# Patient Record
Sex: Female | Born: 1980 | Race: White | Hispanic: No | Marital: Married | Smoking: Never smoker
Health system: Southern US, Community
[De-identification: ages and names within clinical notes are randomized; demographics above are authoritative.]

## PROBLEM LIST (undated history)

## (undated) DIAGNOSIS — G43909 Migraine, unspecified, not intractable, without status migrainosus: Secondary | ICD-10-CM

## (undated) DIAGNOSIS — B019 Varicella without complication: Secondary | ICD-10-CM

## (undated) DIAGNOSIS — O24419 Gestational diabetes mellitus in pregnancy, unspecified control: Secondary | ICD-10-CM

## (undated) HISTORY — DX: Varicella without complication: B01.9

---

## 2007-04-18 HISTORY — PX: BUNIONECTOMY: SHX129

## 2009-05-10 ENCOUNTER — Ambulatory Visit: Payer: Self-pay

## 2009-07-08 ENCOUNTER — Inpatient Hospital Stay: Payer: Self-pay

## 2009-07-16 ENCOUNTER — Ambulatory Visit: Payer: Self-pay | Admitting: Pediatrics

## 2009-09-17 ENCOUNTER — Ambulatory Visit: Payer: Self-pay | Admitting: Pediatrics

## 2012-01-21 ENCOUNTER — Inpatient Hospital Stay: Payer: Self-pay | Admitting: Obstetrics and Gynecology

## 2012-01-21 LAB — COMPREHENSIVE METABOLIC PANEL
Alkaline Phosphatase: 123 U/L (ref 50–136)
Anion Gap: 9 (ref 7–16)
Bilirubin,Total: 1.5 mg/dL — ABNORMAL HIGH (ref 0.2–1.0)
Calcium, Total: 9.2 mg/dL (ref 8.5–10.1)
Chloride: 102 mmol/L (ref 98–107)
Co2: 26 mmol/L (ref 21–32)
EGFR (African American): >60
EGFR (Non-African Amer.): >60
Osmolality: 270 (ref 275–301)
SGOT(AST): 22 U/L (ref 15–37)

## 2012-01-21 LAB — URINALYSIS, COMPLETE
Bilirubin,UR: NEGATIVE
Blood: NEGATIVE
Glucose,UR: NEGATIVE mg/dL (ref 0–75)
Nitrite: NEGATIVE
Ph: 6 (ref 4.5–8.0)
Protein: NEGATIVE
RBC,UR: 3 /HPF (ref 0–5)
Specific Gravity: 1.01 (ref 1.003–1.030)
Squamous Epithelial: 29
WBC UR: 19 /HPF (ref 0–5)

## 2012-01-21 LAB — CBC
MCH: 32.4 pg (ref 26.0–34.0)
MCV: 90 fL (ref 80–100)
Platelet: 189 10*3/uL (ref 150–440)
RDW: 13 % (ref 11.5–14.5)
WBC: 8.3 10*3/uL (ref 3.6–11.0)

## 2012-01-23 DIAGNOSIS — N390 Urinary tract infection, site not specified: Secondary | ICD-10-CM | POA: Insufficient documentation

## 2012-01-23 LAB — URINE CULTURE

## 2012-02-26 DIAGNOSIS — R519 Headache, unspecified: Secondary | ICD-10-CM | POA: Insufficient documentation

## 2012-02-26 DIAGNOSIS — G43809 Other migraine, not intractable, without status migrainosus: Secondary | ICD-10-CM | POA: Insufficient documentation

## 2014-04-17 NOTE — L&D Delivery Note (Signed)
Obstetrical Delivery Note   Date of Delivery:   11/19/2014 at 0124 Primary OB:   [facility] Gestational Age/EDD: [redacted]w[redacted]d (Dated byLMP) Antepartum complications: none  Delivered By:   Farrel Conners, CNM  Delivery Type:   spontaneous vaginal delivery  Procedure Details:   Start of second stage shortly after SROM for clear fluid. While mother in left lateral position, there was a SVD of viable female infant in LOA with left nuchal hand requiring delivery of posterior shoulder first. Good cry with tactile stimulation. Baby placed on mother's chest immediately after birth. Cord clamped after pulsations stopped. Cord cut by FOB.  Anesthesia:    local Intrapartum complications: None GBS:    negative Laceration:    Small 2nd degree repaired with 3-0 Chromic Episiotomy:    No Placenta:    Via active 3rd stage. To pathology: no Estimated Blood Loss:  350 ml  Baby:    Liveborn female, Apgars 7/9, weight pending    Dal Blew, CNM

## 2014-04-20 LAB — HM PAP SMEAR: HM Pap smear: NORMAL

## 2014-08-04 NOTE — Consult Note (Signed)
Referring Physician:  Donzetta Matters :   Primary Care Physician:  [facility], [address], [place], [place] [address], [place]  Dalia Heading [place]) [phone]  Reason for Consult:  Admit Date: 21-Jan-2012   Chief Complaint: vision changes, right arm weakness and numbness and dysarthria, abrupt onset.   Reason for Consult: transient neurologic deficit   History of Present Illness:  History of Present Illness:   34 year old 33.[redacted] week pregnant woman presented to [facility] with vision changes, right arm weakness and numbness and dysarthria.  Her pregnancy has been otherwise uncomplicated.  She had gestational diabetes with her first pregnancy but has not had similar symptoms with this pregnancy.   patient was out doing some errands with her husband earlier today when she noticed that vision in her right visual field looked "like a kaleidoscope".  This symptom came on abruptly and resolved shortly over about 10 minutes.  The symptoms came on while she was standing up and walking around a store.  There have been no recent injuries.  After the symptom resolved, she began to have difficulty getting her words out.  She knew what she wanted to say but couldn't formulate the words.  There was no dysarthria.  At this point, they went back and got in the car and decided to go the ER.  While in the car, her expressive aphasia resolved after a few minutes.  Within the next few minutes she noticed that her right arm felt weak and numb such that she had decreased sensation in the right arm.  This lasted less than 15 minutes and spontaneously resolved. diabetes with her first pregnancy. surgery. history: does not use alcohol, drugs or tobacco. History:FH of neurologic disease, autoimmune disease, clotting disorders or strokes.    GENERAL.and atraumatic. exam without pharmacologic dilation shows normal disc size, appearance and C/D ratio.  There is no  papilledema. and S2 sounds are within normal limits, without murmurs, gallops, or rubs. - Normal- NormalDrift - Absent bilaterally.- Gait and station are within normal limits.    Shoulder abduction (deltoid/supraspinatus, axillary/suprascapular n, C5)   Elbow flexion (biceps brachii, musculoskeletal n, C5-6)   Elbow extension (triceps, radial n, C7)   Finger adduction (interossei, ulnar n, T1)    Hip flexion (iliopsoas, L1/L2)   Knee flexion (hamstrings, sciatic n, L5/S1)    Knee extension (quadriceps, femoral n, L3/4)   Ankle dorsiflexion (tibialis anterior, deep fibular n, L4/5)   Ankle plantarflexion (gastroc, tibial n, S1)  STATUS.is oriented to person, place and time.  Recent and remote memory are intact.  Attention span and concentration are intact.  Naming, repetition, comprehension and expressive speech are within normal limits.  Patient's fund of knowledge is within normal limits for educational level. NERVES.   CN II (normal visual acuity and visual fields)   CN III, IV, VI (extraocular muscles are intact)   CN V (facial sensation is intact bilaterally)   CN VII (facial strength is intact bilaterally)   CN VIII (hearing is intact bilaterally)   CN IX/X (palate elevates midline, normal phonation)   CN XI (shoulder shrug strength is normal and symmetric)   CN XII (tongue protrudes midline) to pain and temp bilaterally (spinothalamic tracts)to position and vibration bilaterally (dorsal columns)    Biceps   Brachioradialis    Patellar   Achilles  Finger to nose testing is within normal limits bilaterally.have personally viewed and interpreted the MRI, MRA and MRV scans.  The MRI is  negative for infarct.  The MRA is unremarkable.  There is abnormal signal in the right internal jugular vein and sigmoid sinus more likely representing thrombus than slow flow.  There is only a T1 sagittal view.  The patient needs a coronal and axial views of time of flight.  There was only a sagittal time of flight which  is limited.  Alternatively, the patient could have a carotid ultrasound and if this is negative then could perform the coronal and axial views time of flight.   [place] and Dr. Guss Bunde with [facility] in [facility] who emergently read this scan. 34 year old 33.[redacted] week pregnant woman presented to [facility] with vision changes, right arm weakness and numbness and dysarthria.  Her symptoms are most consistent with TIA.  Her MRV as above shows likely clot in the right internal jugular vein.   shows her prothrombotic state of pregnancy has contributed to her presentation today.  She should be screened for common causes of prothrombotic state including ATIII, protein C, protein S, factor V leiden, hyperhomocysteinemia, lupus anticoagulant, and prothrombin G20210A.   I spoke with radiology Dr Allena Katz about the results obtained from Drs. Jimmey Ralph and [place] with [facility] showing a likely clot in the right internal jugular vein and sigmoid sinus as outlined above.  As such, we have all agreed to start anticoagulation with lovenox.  I discussed this with Dr. Patton Salles who agrees that starting lovenox is the most appropriate anticoagulation choice at this time.  The patient will require a stat ultrasound to evaluate for venous thrombosis in the right internal jugular vein.  If this test is negative, then she will need to repeat her MRV with coronal and axial plane images.  If the test is positive for DVT then we will not need to do the repeat MRV sequence.    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     ROS:   General denies complaints    HEENT no complaints    Lungs no complaints    Cardiac no complaints    GI no complaints    GU no complaints    Musculoskeletal no complaints    Extremities no complaints    Skin no complaints    Endocrine no complaints    Psych no complaints   Home Medications: Medication Instructions Last Modified Date/Time  prenatal vitamin 1 tab(s) orally once a day  24-Mar-11 14:49   [facility] Current Meds:  Dextrose 5%-NaCl 0.45%, 1000 ml at 125 ml/hr  Aspirin Chewable, 81 mg Oral daily  - Indication: Pain/Fever/Thromboembolic Disorders/Post MI/Prophylaxis MI  Acetaminophen * tablet,  ( Tylenol (325 mg) tablet)  650 mg Oral q4h PRN for pain or temp. greater than 100.4  - Indication: Pain/Fever  Allergies:  No Known Allergies:   Vital Signs: **Vital Signs.:   06-Oct-13 16:17   Vital Signs Type Admission   Temperature Temperature (F) 97.8   Celsius 36.5   Pulse Pulse 68   Pulse source if not from Vital Sign Device apical   Respirations Respirations 18   Systolic BP Systolic BP 122  Diastolic BP (mmHg) Diastolic BP (mmHg) 93   Mean BP 102   Pulse Ox % Pulse Ox % 99   Pulse Ox Activity Level  At rest   Oxygen Delivery Room Air/ 21 %   Lab Results:  Hepatic:  06-Oct-13 13:46    Bilirubin, Total  1.5   Alkaline Phosphatase 123   SGPT (ALT) 13   SGOT (AST) 22   Total Protein, Serum 7.5   Albumin, Serum  3.1  Routine Chem:  06-Oct-13 13:46    Result Comment pt/ptt - tube over-filled  Result(s) reported on 21 Jan 2012 at 02:18PM.   Glucose, Serum 66   BUN 8   Creatinine (comp)  0.56   Sodium, Serum 137   Potassium, Serum 3.6   Chloride, Serum 102   CO2, Serum 26   Calcium (Total), Serum 9.2   Osmolality (calc) 270   eGFR (African American) >60   eGFR (Non-African American) >60 (eGFR values <26mL/min/1.73 m2 may be an indication of chronic kidney disease (CKD). Calculated eGFR is  useful in patients with stable renal function. The eGFR calculation will not be reliable in acutely ill patients when serum creatinine is changing rapidly. It is not useful in  patients on dialysis. The eGFR calculation may not be applicable to patients at the low and high extremes of body sizes, pregnant women, and vegetarians.)   Anion Gap 9  Routine UA:  06-Oct-13 16:20    Color (UA) Straw   Clarity (UA) Cloudy   Glucose (UA) Negative   Bilirubin (UA) Negative   Ketones (UA) 2+   Specific Gravity (UA) 1.010   Blood (UA) Negative   pH (UA) 6.0   Protein (UA) Negative   Nitrite (UA) Negative   Leukocyte Esterase (UA) 3+ (Result(s) reported on 21 Jan 2012 at 04:46PM.)   RBC (UA) 3 /HPF   WBC (UA) 19 /HPF   Bacteria (UA) 1+   Epithelial Cells (UA) 29 /HPF   Mucous (UA) PRESENT (Result(s) reported on 21 Jan 2012 at 04:46PM.)  Routine Coag:  06-Oct-13 13:46    Activated PTT (APTT) - (A HCT value >55% may artifactually increase the APTT. In one study, the increase was an average of 19%. Reference: "Effect on Routine and Special Coagulation Testing Values of Citrate Anticoagulant Adjustment in Patients with High HCT Values." American Journal of Clinical Pathology 2006;126:400-405.)   Prothrombin -   INR - (INR reference interval applies to patients on anticoagulant therapy. A single INR therapeutic range for coumarins is not optimal for all indications; however, the suggested range for most indications is 2.0 - 3.0. Exceptions to the INR Reference Range may include: Prosthetic heart valves, acute myocardial infarction, prevention of myocardial infarction, and combinations of aspirin and anticoagulant. The need for a higher or lower target INR must be assessed individually. Reference: The Pharmacology and Management of the Vitamin K  antagonists: the seventh ACCP Conference on Antithrombotic and Thrombolytic Therapy. ESPQZ.3007 Sept:126 (3suppl): [id number]. A HCT value >55% may  artifactually increase the PT.  In one study,  the increase was an average of 25%. Reference:  "Effect on Routine and Special Coagulation Testing Values of Citrate Anticoagulant Adjustment in Patients with High HCT Values." American Journal of Clinical Pathology 2006;126:400-405.)  Routine Hem:  06-Oct-13 13:46    WBC (CBC) 8.3   RBC (CBC) 4.52   Hemoglobin (CBC) 14.7   Hematocrit (CBC) 40.7   Platelet Count (CBC) 189 (Result(s) reported on 21 Jan 2012 at 02:30PM.)   MCV  36   MCH 32.4   MCHC 36.0   RDW 13.0   Radiology Results: CT:    06-Oct-13 13:56, CT Head Without Contrast   CT Head Without Contrast    REASON FOR EXAM:    R sided numbness, vision loss, difficulty with speech   - x 1 hr - [redacted] wks pregnant  COMMENTS:   May transport without cardiac monitor    PROCEDURE: CT  - CT HEAD WITHOUT CONTRAST  - Jan 21 2012  1:56PM     RESULT: Comparison:  None    Technique: Multiple axial images from the foramen magnum to the vertex   were obtained without IV contrast.    Findings:      There is no evidence of mass effect, midline shift, or extra-axial fluid   collections.  There is no evidence of a space-occupying lesion or     intracranial hemorrhage. There is no evidence of a cortical-based area of   acute infarction.      The ventricles and sulci are appropriate for the patient's age. The basal   cisterns are patent.    Visualized portions of the orbits are unremarkable. The visualized   portions of the paranasal sinuses and mastoid air cells are unremarkable.     The osseous structures are unremarkable.    IMPRESSION:      No acute intracranial process.    Dictation Site: 1          Verified By: Jennette Banker, M.D., MD   Electronic Signatures: Anabel Bene (MD)  (Signed 06-Oct-13 21:08)  Authored: REFERRING PHYSICIAN, Primary Care Physician, Consult, History of Present Illness, Review of Systems, HOME MEDICATIONS, Current Medications, ALLERGIES, NURSING  VITAL SIGNS, LAB RESULTS, RADIOLOGY RESULTS   Last Updated: 06-Oct-13 21:08 by Anabel Bene (MD)

## 2014-11-18 DIAGNOSIS — O09293 Supervision of pregnancy with other poor reproductive or obstetric history, third trimester: Secondary | ICD-10-CM | POA: Diagnosis present

## 2014-11-18 DIAGNOSIS — Z3A40 40 weeks gestation of pregnancy: Secondary | ICD-10-CM | POA: Diagnosis present

## 2014-11-18 DIAGNOSIS — O480 Post-term pregnancy: Principal | ICD-10-CM | POA: Diagnosis present

## 2014-11-18 DIAGNOSIS — O322XX0 Maternal care for transverse and oblique lie, not applicable or unspecified: Secondary | ICD-10-CM | POA: Diagnosis not present

## 2014-11-18 DIAGNOSIS — Z3009 Encounter for other general counseling and advice on contraception: Secondary | ICD-10-CM

## 2014-11-19 ENCOUNTER — Inpatient Hospital Stay
Admission: EM | Admit: 2014-11-19 | Discharge: 2014-11-20 | DRG: 775 | Disposition: A | Discharge status: Home / Self Care | Payer: BLUE CROSS/BLUE SHIELD | Attending: Certified Nurse Midwife | Admitting: Certified Nurse Midwife

## 2014-11-19 ENCOUNTER — Encounter: Payer: Self-pay | Admitting: Certified Nurse Midwife

## 2014-11-19 DIAGNOSIS — Z3009 Encounter for other general counseling and advice on contraception: Secondary | ICD-10-CM | POA: Diagnosis not present

## 2014-11-19 DIAGNOSIS — O322XX0 Maternal care for transverse and oblique lie, not applicable or unspecified: Secondary | ICD-10-CM | POA: Diagnosis not present

## 2014-11-19 DIAGNOSIS — O480 Post-term pregnancy: Secondary | ICD-10-CM | POA: Diagnosis present

## 2014-11-19 DIAGNOSIS — Z3490 Encounter for supervision of normal pregnancy, unspecified, unspecified trimester: Secondary | ICD-10-CM

## 2014-11-19 DIAGNOSIS — O09293 Supervision of pregnancy with other poor reproductive or obstetric history, third trimester: Secondary | ICD-10-CM | POA: Diagnosis present

## 2014-11-19 DIAGNOSIS — Z3A40 40 weeks gestation of pregnancy: Secondary | ICD-10-CM | POA: Diagnosis present

## 2014-11-19 HISTORY — DX: Migraine, unspecified, not intractable, without status migrainosus: G43.909

## 2014-11-19 HISTORY — DX: Gestational diabetes mellitus in pregnancy, unspecified control: O24.419

## 2014-11-19 LAB — CBC
HCT: 37.8 % (ref 35.0–47.0)
Hemoglobin: 13 g/dL (ref 12.0–16.0)
MCH: 31.2 pg (ref 26.0–34.0)
MCHC: 34.4 g/dL (ref 32.0–36.0)
MCV: 90.7 fL (ref 80.0–100.0)
Platelets: 177 10*3/uL (ref 150–440)
RBC: 4.17 MIL/uL (ref 3.80–5.20)
RDW: 12.9 % (ref 11.5–14.5)
WBC: 13.9 10*3/uL — AB (ref 3.6–11.0)

## 2014-11-19 LAB — ABO/RH: ABO/RH(D): B POS

## 2014-11-19 MED ORDER — LIDOCAINE HCL (PF) 1 % IJ SOLN
30.0000 mL | INTRAMUSCULAR | Status: DC | PRN
  Filled 2014-11-19: qty 30

## 2014-11-19 MED ORDER — ONDANSETRON HCL 4 MG/2ML IJ SOLN: 4.0000 mg | INTRAMUSCULAR | Status: DC | PRN

## 2014-11-19 MED ORDER — IBUPROFEN 600 MG PO TABS
600.0000 mg | ORAL_TABLET | Freq: Four times a day (QID) | ORAL | Status: DC | PRN
  Administered 2014-11-19: 600 mg via ORAL
  Filled 2014-11-19 (×2): qty 1

## 2014-11-19 MED ORDER — LACTATED RINGERS IV SOLN: 500.0000 mL | INTRAVENOUS | Status: DC | PRN

## 2014-11-19 MED ORDER — LACTATED RINGERS IV SOLN: INTRAVENOUS | Status: DC

## 2014-11-19 MED ORDER — ONDANSETRON HCL 4 MG PO TABS: 4.0000 mg | ORAL_TABLET | ORAL | Status: DC | PRN

## 2014-11-19 MED ORDER — SIMETHICONE 80 MG PO CHEW: 80.0000 mg | CHEWABLE_TABLET | ORAL | Status: DC | PRN

## 2014-11-19 MED ORDER — LIDOCAINE HCL (PF) 1 % IJ SOLN
INTRAMUSCULAR | Status: AC
  Filled 2014-11-19: qty 30

## 2014-11-19 MED ORDER — BENZOCAINE-MENTHOL 20-0.5 % EX AERO: 1.0000 "application " | INHALATION_SPRAY | CUTANEOUS | Status: DC | PRN

## 2014-11-19 MED ORDER — ONDANSETRON HCL 4 MG/2ML IJ SOLN: 4.0000 mg | Freq: Four times a day (QID) | INTRAMUSCULAR | Status: DC | PRN

## 2014-11-19 MED ORDER — OXYTOCIN BOLUS FROM INFUSION
500.0000 mL | INTRAVENOUS | Status: DC
  Administered 2014-11-19: 500 mL via INTRAVENOUS

## 2014-11-19 MED ORDER — DOCUSATE SODIUM 100 MG PO CAPS: 100.0000 mg | ORAL_CAPSULE | Freq: Two times a day (BID) | ORAL | Status: DC | PRN

## 2014-11-19 MED ORDER — OXYTOCIN 40 UNITS IN LACTATED RINGERS INFUSION - SIMPLE MED
62.5000 mL/h | INTRAVENOUS | Status: DC
  Administered 2014-11-19: 62.5 mL/h via INTRAVENOUS

## 2014-11-19 MED ORDER — MISOPROSTOL 200 MCG PO TABS
800.0000 ug | ORAL_TABLET | Freq: Once | ORAL | Status: DC | PRN
  Filled 2014-11-19: qty 4

## 2014-11-19 MED ORDER — LANOLIN HYDROUS EX OINT: TOPICAL_OINTMENT | CUTANEOUS | Status: DC | PRN

## 2014-11-19 MED ORDER — PRENATAL MULTIVITAMIN CH
1.0000 | ORAL_TABLET | Freq: Every day | ORAL | Status: DC
  Administered 2014-11-19: 1 via ORAL
  Filled 2014-11-19 (×2): qty 1

## 2014-11-19 MED ORDER — DIBUCAINE 1 % RE OINT: 1.0000 "application " | TOPICAL_OINTMENT | RECTAL | Status: DC | PRN

## 2014-11-19 MED ORDER — OXYTOCIN 40 UNITS IN LACTATED RINGERS INFUSION - SIMPLE MED
INTRAVENOUS | Status: AC
  Administered 2014-11-19: 62.5 mL/h via INTRAVENOUS
  Filled 2014-11-19: qty 1000

## 2014-11-19 MED ORDER — OXYTOCIN 40 UNITS IN LACTATED RINGERS INFUSION - SIMPLE MED: 62.5000 mL/h | INTRAVENOUS | Status: DC | PRN

## 2014-11-19 MED ORDER — BUTORPHANOL TARTRATE 1 MG/ML IJ SOLN: 1.0000 mg | INTRAMUSCULAR | Status: DC | PRN

## 2014-11-19 MED ORDER — WITCH HAZEL-GLYCERIN EX PADS: 1.0000 "application " | MEDICATED_PAD | CUTANEOUS | Status: DC | PRN

## 2014-11-19 MED ORDER — HYDROCODONE-ACETAMINOPHEN 5-325 MG PO TABS: 1.0000 | ORAL_TABLET | ORAL | Status: DC | PRN

## 2014-11-19 MED ORDER — AMMONIA AROMATIC IN INHA: 0.3000 mL | Freq: Once | RESPIRATORY_TRACT | Status: DC | PRN

## 2014-11-19 NOTE — Progress Notes (Signed)
  Postpartum Day 0  Subjective: no complaints, up ad lib, voiding and tolerating PO  Objective: Blood pressure 107/61, pulse 71, temperature 98.4 F (36.9 C), temperature source Oral, resp. rate 18, height  (1.702 m), weight 157 lb (71.215 kg), last menstrual period 02/11/2014, SpO2 100 %  Physical Exam:  General: alert and cooperative Lochia: appropriate Uterine Fundus: firm Incision: N/A DVT Evaluation: No evidence of DVT seen on physical exam. Abdomen: soft, NT   Recent Labs  11/19/14 0034  HGB 13.0  HCT 37.8    Assessment PPD #0  Plan: Continue PP care and Advance activity as tolerated  Feeding: breast Contraception: condoms - considering vasectomy or IUD at a later time RI/VI Blood Type: B+   Marta Antu, [place] 11/19/2014, 12:49 PM

## 2014-11-19 NOTE — Lactation Note (Signed)
This note was copied from the chart of Boy Presly Steinruck. Lactation Consultation Note  Patient Name: Boy Rosalia Mcavoy [id number] Date: 11/19/2014     Maternal Data   Mother has no questions. Feeding Feeding Type: Breast Fed Length of feed: 25 min  LATCH Score/Interventions                      Lactation Tools Discussed/Used     Consult Status      Trudee Grip 11/19/2014, 12:29 PM

## 2014-11-19 NOTE — H&P (Signed)
OB History & Physical   History of Present Illness:  Chief Complaint:   HPI:  Sara James is a 34 y.o. G3P0 female at 17 1/[redacted] weeks gestation by EDC=11/18/2014 based on LMP=02/12/2015.  Her pregnancy has been complicated by a history of shoulder dystocia and atypical migraines with G2 and GDM with G1.  She presents to L&D for evaluation of contractions. Contractions started to become more intense and regular at 2200. No VB or LOF. Prenatal care site: Prenatal care at [facility] has been remarkable for normal first trimester test, normal glucola screening, and concerns for a low lying placenta which resolved.      Maternal Medical History:   Past Medical History  Diagnosis Date  . Migraines     hemiplegic migraines with G2  . Gestational diabetes     G1    Past Surgical History  Procedure Laterality Date  . Bunionectomy      No Known Allergies  Prior to Admission medications   Medication Sig Start Date End Date Taking? Authorizing Provider  Prenatal Vit-Fe Fumarate-FA (MULTIVITAMIN-PRENATAL) 27-0.8 MG TABS tablet Take 1 tablet by mouth daily at 12 noon.   Yes Historical Provider, MD          Social History: She  reports that she has never smoked. She does not have any smokeless tobacco history on file. She reports that she does not drink alcohol or use illicit drugs.  Family History: family history includes Cancer in her paternal grandmother; Diabetes in her maternal grandmother and paternal grandmother; Heart disease in her father and paternal grandfather; Hypertension in her father.   Review of Systems: Negative x 10 systems reviewed except as noted in the HPI.      Physical Exam:  Vital Signs: BP 127/80 mmHg  Pulse 74 General: no acute distress.  HEENT: normocephalic, atraumatic Heart: regular rate & rhythm.  No murmurs/rubs/gallops Lungs: clear to auscultation bilaterally Abdomen: soft, gravid, non-tender;  EFW: 7 1/2#. vertex Pelvic:   External: Normal  external female genitalia  Cervix: 6/90%/-1 per RN exam.  Extremities: non-tender, symmetric, no edema bilaterally. Neurologic: Alert & oriented x 3.    Pertinent Results:  Prenatal Labs: Blood type/Rh B positive  Antibody screen negative  Rubella Varicella Immune immune  RPR negative  HBsAg negative  HIV negative  GC negative  Chlamydia negative  Genetic screening First trimester test negative  1 hour GTT 93/ 132  3 hour GTT negative  GBS negative on 7/8   Baseline FHR: 120s with accelerations to 150s to 160, moderate variability Toco: q2-3 min apart   Assessment:  Sara James is a 34 y.o. G3P0 female at 35 1/7 weeks in active labor  Reassuring fetal heart status History of precipitous labor with G2  Plan:  1. Admit to Labor & Delivery  2. CBC, ABO/RH, Clrs, saline lock 3. GBS negative  4. Consents obtained. 5. Can monitor intermittently if ambulating  Sara James  11/19/2014 12:57 AM

## 2014-11-20 LAB — CBC
HCT: 35.4 % (ref 35.0–47.0)
HEMOGLOBIN: 12.1 g/dL (ref 12.0–16.0)
MCH: 31 pg (ref 26.0–34.0)
MCHC: 34.1 g/dL (ref 32.0–36.0)
MCV: 90.8 fL (ref 80.0–100.0)
PLATELETS: 175 10*3/uL (ref 150–440)
RBC: 3.89 MIL/uL (ref 3.80–5.20)
RDW: 13.4 % (ref 11.5–14.5)
WBC: 12.5 10*3/uL — ABNORMAL HIGH (ref 3.6–11.0)

## 2014-11-20 LAB — RPR: RPR Ser Ql: NONREACTIVE

## 2014-11-20 NOTE — Progress Notes (Signed)
Pt discharged at this time via wheelchair escorted by hospital volunteers; pt going home with husband and new baby 

## 2014-11-20 NOTE — Discharge Instructions (Signed)
Please call [facility] and make your own 6 week follow up appointment with Farrel Conners, midwife [phone]  For 2 weeks:  No driving For 6 weeks:  No heavy lifting or strenuous activity For 6 weeks:  No tampons, douches, enemas or sexual intercourse  Call doctor or return to ER:  Temperature of 100.4 or higher; increased abdominal pain; increased vaginal bleeding (passing blood clots the size of your fist or larger OR having to change your pad every hour because it's saturated); chest pain or difficulty breathing; any concerns about postpartum depression (not wanting to see your husband or family, not wanting to see or care for the baby, not having your normal appetite, not having your normal emotions); or for any other concerns  Also call your doctor if:  You have hard, reddened, firm areas at your breast; difficult breastfeeding [facility] Lactation is also a resource for breastfeeding questions/concerns:  [phone]  Tylenol (regular strength):  ; take 2 tablets by mouth every 4-6 hours as needed for pain Ibuprofen (Advil, Motrin):  ; take 3 tablets by mouth every 6 hours as needed for pain Prenatal vitamin:  Take 1 tablet by mouth once a day the whole time you are breastfeeding Colace (stool softener):  May take once or twice a day as needed   Vaginal Delivery, Care After Refer to this sheet in the next few weeks. These discharge instructions provide you with information on caring for yourself after delivery. Your caregiver may also give you specific instructions. Your treatment has been planned according to the most current medical practices available, but problems sometimes occur. Call your caregiver if you have any problems or questions after you go home. HOME CARE INSTRUCTIONS  Take over-the-counter or prescription medicines only as directed by your caregiver or pharmacist.  Do not drink alcohol, especially if you are breastfeeding or taking medicine to relieve  pain.  Do not chew or smoke tobacco.  Do not use illegal drugs.  Continue to use good perineal care. Good perineal care includes:  Wiping your perineum from front to back.  Keeping your perineum clean.  Do not use tampons or douche until your caregiver says it is okay.  Shower, wash your hair, and take tub baths as directed by your caregiver.  Wear a well-fitting bra that provides breast support.  Eat healthy foods.  Drink enough fluids to keep your urine clear or pale yellow.  Eat high-fiber foods such as whole grain cereals and breads, brown rice, beans, and fresh fruits and vegetables every day. These foods may help prevent or relieve constipation.  Follow your caregiver's recommendations regarding resumption of activities such as climbing stairs, driving, lifting, exercising, or traveling.  Talk to your caregiver about resuming sexual activities. Resumption of sexual activities is dependent upon your risk of infection, your rate of healing, and your comfort and desire to resume sexual activity.  Try to have someone help you with your household activities and your newborn for at least a few days after you leave the hospital.  Rest as much as possible. Try to rest or take a nap when your newborn is sleeping.  Increase your activities gradually.  Keep all of your scheduled postpartum appointments. It is very important to keep your scheduled follow-up appointments. At these appointments, your caregiver will be checking to make sure that you are healing physically and emotionally. SEEK MEDICAL CARE IF:   You are passing large clots from your vagina. Save any clots to show your caregiver.  You  have a foul smelling discharge from your vagina.  You have trouble urinating.  You are urinating frequently.  You have pain when you urinate.  You have a change in your bowel movements.  You have increasing redness, pain, or swelling near your vaginal incision (episiotomy) or  vaginal tear.  You have pus draining from your episiotomy or vaginal tear.  Your episiotomy or vaginal tear is separating.  You have painful, hard, or reddened breasts.  You have a severe headache.  You have blurred vision or see spots.  You feel sad or depressed.  You have thoughts of hurting yourself or your newborn.  You have questions about your care, the care of your newborn, or medicines.  You are dizzy or light-headed.  You have a rash.  You have nausea or vomiting.  You were breastfeeding and have not had a menstrual period within 12 weeks after you stopped breastfeeding.  You are not breastfeeding and have not had a menstrual period by the 12th week after delivery.  You have a fever. SEEK IMMEDIATE MEDICAL CARE IF:   You have persistent pain.  You have chest pain.  You have shortness of breath.  You faint.  You have leg pain.  You have stomach pain.  Your vaginal bleeding saturates two or more sanitary pads in 1 hour. MAKE SURE YOU:   Understand these instructions.  Will watch your condition.  Will get help right away if you are not doing well or get worse. Document Released: 03/31/2000 Document Revised: 08/18/2013 Document Reviewed: 11/29/2011 [facility] Patient Information 2015 [place], [place]. This information is not intended to replace advice given to you by your health care provider. Make sure you discuss any questions you have with your health care provider.

## 2014-12-22 ENCOUNTER — Ambulatory Visit: Payer: BLUE CROSS/BLUE SHIELD | Admitting: Nurse Practitioner

## 2014-12-23 ENCOUNTER — Encounter: Payer: Self-pay | Admitting: Nurse Practitioner

## 2014-12-23 ENCOUNTER — Ambulatory Visit (INDEPENDENT_AMBULATORY_CARE_PROVIDER_SITE_OTHER): Payer: BLUE CROSS/BLUE SHIELD | Admitting: Nurse Practitioner

## 2014-12-23 ENCOUNTER — Encounter (INDEPENDENT_AMBULATORY_CARE_PROVIDER_SITE_OTHER): Payer: Self-pay

## 2014-12-23 VITALS — BP 104/84 | HR 65 | Temp 98.1°F | Resp 14 | Ht 67.0 in | Wt 137.0 lb

## 2014-12-23 DIAGNOSIS — Z7189 Other specified counseling: Secondary | ICD-10-CM

## 2014-12-23 DIAGNOSIS — Z7689 Persons encountering health services in other specified circumstances: Secondary | ICD-10-CM

## 2014-12-23 DIAGNOSIS — M654 Radial styloid tenosynovitis [de Quervain]: Secondary | ICD-10-CM | POA: Insufficient documentation

## 2014-12-23 DIAGNOSIS — Z0000 Encounter for general adult medical examination without abnormal findings: Secondary | ICD-10-CM | POA: Insufficient documentation

## 2014-12-23 NOTE — Patient Instructions (Signed)
De Quervain's Disease De Quervain's disease is a condition often seen in racquet sports where there is a soreness (inflammation) in the cord like structures (tendons) which attach muscle to bone on the thumb side of the wrist. There may be a tightening of the tissuesaround the tendons. This condition is often helped by giving up or modifying the activity which caused it. When conservative treatment does not help, surgery may be required. Conservative treatment could include changes in the activity which brought about the problem or made it worse. Anti-inflammatory medications and injections may be used to help decrease the inflammation and help with pain control. Your caregiver will help you determine which is best for you. DIAGNOSIS  Often the diagnosis (learning what is wrong) can be made by examination. Sometimes x-rays are required. HOME CARE INSTRUCTIONS   Apply ice to the sore area for 15-20 minutes, 03-04 times per day while awake. Put the ice in a plastic bag and place a towel between the bag of ice and your skin. This is especially helpful if it can be done after all activities involving the sore wrist.  Temporary splinting may help.  Only take over-the-counter or prescription medicines for pain, discomfort or fever as directed by your caregiver. SEEK MEDICAL CARE IF:   Pain relief is not obtained with medications, or if you have increasing pain and seem to be getting worse rather than better. MAKE SURE YOU:   Understand these instructions.  Will watch your condition.  Will get help right away if you are not doing well or get worse. Document Released: 12/27/2000 Document Revised: 06/26/2011 Document Reviewed: 08/06/2013 ExitCare Patient Information 2015 [employer]. This information is not intended to replace advice given to you by your health care provider. Make sure you discuss any questions you have with your health care provider.  

## 2014-12-23 NOTE — Assessment & Plan Note (Signed)
Discussed acute and chronic issues. Reviewed health maintenance measures, PFSHx, and immunizations. Obtain records from previous facility.   

## 2014-12-23 NOTE — Progress Notes (Signed)
Pre visit review using our clinic review tool, if applicable. No additional management support is needed unless otherwise documented below in the visit note. 

## 2014-12-23 NOTE — Progress Notes (Signed)
Patient ID: Sara James, female    DOB: 12/14/1980  Age: 34 y.o. MRN: [id number]  CC: Establish Care   HPI Sara James presents for establishing care and CC of right wrist pain x 3 months.   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   She has past surgical history that includes Bunionectomy (2009).   Her family history includes Cancer in her paternal grandmother; Diabetes in her maternal grandmother and paternal grandmother; Heart disease in her father and paternal grandfather; Hypertension in her father.She reports that she has never smoked. She has never used smokeless tobacco. She reports that she drinks alcohol. She reports that she does not use illicit drugs.  Outpatient Prescriptions Prior to Visit  Medication Sig Dispense Refill  . Prenatal Vit-Fe Fumarate-FA (MULTIVITAMIN-PRENATAL) 27-0.8 MG TABS tablet Take 1 tablet by mouth daily at 12 noon.     No facility-administered medications prior to visit.    ROS Review of Systems  Constitutional: Negative for fever, chills, diaphoresis and fatigue.  Respiratory: Negative for chest tightness, shortness of breath and wheezing.   Cardiovascular: Negative for chest pain, palpitations and leg swelling.  Gastrointestinal: Negative for nausea, vomiting and diarrhea.  Musculoskeletal: Positive for myalgias.  Skin: Negative for rash.   Neurological: Negative for dizziness, weakness, numbness and headaches.  Psychiatric/Behavioral: Negative for suicidal ideas and sleep disturbance. The patient is not nervous/anxious.     Objective:  BP 104/84 mmHg  Pulse 65  Temp(Src) 98.1 F (36.7 C)  Resp 14  Ht  (1.702 m)  Wt 137 lb (62.143 kg)  BMI 21.45 kg/m2  SpO2 99%  Physical Exam  Constitutional: She is oriented to person, place, and time. She appears well-developed and well-nourished. No distress.  HENT:  Head: Normocephalic and atraumatic.  Right Ear: External ear normal.  Left Ear: External ear normal.  Cardiovascular: Normal rate and regular rhythm.   Pulmonary/Chest: Effort normal and breath sounds normal. No respiratory distress. She has no wheezes. She has no rales. She exhibits no tenderness.  Musculoskeletal: Normal range of motion. She exhibits tenderness. She exhibits no edema.  Positive Finkelstein's  Neurological: She is alert and oriented to person, place, and time. No cranial nerve deficit. She exhibits normal muscle tone. Coordination normal.  Decreased grip strength on right  Skin: Skin is warm and dry. No rash noted. She is not diaphoretic.  Psychiatric: She has a normal mood and affect. Her behavior is normal. Judgment and thought content normal.    Assessment & Plan:   Sara James was seen today for establish care.  Diagnoses and all orders for this visit:  De Quervain's disease (tenosynovitis) -     Ambulatory referral to Orthopedic Surgery  Encounter to establish care  I am having Sara James maintain her multivitamin-prenatal and nystatin cream.  Meds ordered this encounter  Medications  . nystatin cream (MYCOSTATIN)    Sig: Apply 1 application topically 2 (two) times daily.     Follow-up: Return if symptoms worsen or fail to improve.

## 2015-01-05 NOTE — Discharge Summary (Signed)
Obstetrical Discharge Summary  Date of Admission: 11/19/2014 Date of Discharge: 11/20/14 Primary OB: [facility]  Gestational Age at Delivery: [redacted]w[redacted]d   Antepartum complications: none Reason for Admission: labor Date of Delivery:  11/19/14 Delivered By: Farrel Conners, CNM Delivery Type: spontaneous vaginal delivery Intrapartum complications/course:  uneventful Anesthesia: none Placenta: sponatneous Laceration: 2nd degree Episiotomy: none Newborn Data: Live born female  Birth Weight: 8 lb 2.9 oz (3710 g) APGAR: 7, 9    Discharge Physical Exam:  General: NAD CV: RRR Pulm: CTABL, nl effort ABD: s/nd/nt, fundus firm and below the umbilicus Lochia: moderate DVT Evaluation: LE non-ttp, no evidence of DVT on exam.  HEMOGLOBIN  Date Value Ref Range Status  11/20/2014 12.1 12.0 - 16.0 g/dL Final   HGB  Date Value Ref Range Status  01/21/2012 14.7 12.0-16.0 g/dL Final   HCT  Date Value Ref Range Status  11/20/2014 35.4 35.0 - 47.0 % Final  01/21/2012 40.7 35.0-47.0 % Final    Post partum course: routine Postpartum Procedures: none Disposition: stable, discharge to home.  Rh Immune globulin given: no Rubella vaccine given: no Tdap vaccine given in AP or PP setting: antepartum Flu vaccine given in AP or PP setting: no  Contraception: vasectomy vs IUD  Prenatal Labs:  Blood type/Rh B positive  Antibody screen negative  Rubella Varicella Immune immune  RPR negative  HBsAg negative  HIV negative  GC negative  Chlamydia negative  Genetic screening First trimester test negative  1 hour GTT 93/ 132  3 hour GTT negative  GBS negative on 7/8          Plan:  Sara James was discharged to home in good condition. Follow-up appointment at [facility] with in 6 weeks   Discharge Medications:   Medication List    ASK your doctor about these medications        multivitamin-prenatal 27-0.8 MG Tabs tablet  Take 1 tablet by mouth  daily at 12 noon.

## 2015-01-20 ENCOUNTER — Ambulatory Visit (INDEPENDENT_AMBULATORY_CARE_PROVIDER_SITE_OTHER): Payer: BLUE CROSS/BLUE SHIELD | Admitting: Nurse Practitioner

## 2015-01-20 ENCOUNTER — Encounter: Payer: Self-pay | Admitting: Nurse Practitioner

## 2015-01-20 VITALS — BP 100/62 | HR 75 | Temp 98.9°F | Resp 14 | Ht 67.0 in | Wt 132.2 lb

## 2015-01-20 DIAGNOSIS — N61 Mastitis without abscess: Secondary | ICD-10-CM | POA: Insufficient documentation

## 2015-01-20 MED ORDER — CEPHALEXIN 500 MG PO TABS
500.0000 mg | ORAL_TABLET | Freq: Four times a day (QID) | ORAL | Status: DC
Start: 2015-01-20 — End: 2015-09-20

## 2015-01-20 NOTE — Assessment & Plan Note (Addendum)
Treat with Cephalexin 4 x daily for 10 days and encouraged probiotics for 1 month. Pt has had before. RTC if increased redness, swelling, warmth, tenderness with treatment.

## 2015-01-20 NOTE — Progress Notes (Signed)
Pre visit review using our clinic review tool, if applicable. No additional management support is needed unless otherwise documented below in the visit note. 

## 2015-01-20 NOTE — Progress Notes (Signed)
Patient ID: Sara James, female    DOB: 03-01-1981  Age: 34 y.o. MRN: [id number]  CC: Breast Pain   HPI Sara James presents for left breast pain x 2 days.   1) Pain and redness with a 103 fever recently. Had leftover cephalexin from 2 years ago and took some. Breast is painful, red, and swollen. Started yesterday at 4 and by 6 pm was in pain. Tylenol and Advil swapping since yesterday.   History Gem has a past medical history of Migraines; Gestational diabetes; and Chicken pox.   She has past surgical history that includes Bunionectomy (2009).   Her family history includes Cancer in her paternal grandmother; Diabetes in her maternal grandmother and paternal grandmother; Heart disease in her father and paternal grandfather; Hypertension in her father.She reports that she has never smoked. She has never used smokeless tobacco. She reports that she drinks alcohol. She reports that she does not use illicit drugs.  Outpatient Prescriptions Prior to Visit  Medication Sig Dispense Refill  . Prenatal Vit-Fe Fumarate-FA (MULTIVITAMIN-PRENATAL) 27-0.8 MG TABS tablet Take 1 tablet by mouth daily at 12 noon.    . nystatin cream (MYCOSTATIN) Apply 1 application topically 2 (two) times daily.     No facility-administered medications prior to visit.    ROS Review of Systems  Constitutional: Negative for fever, chills, diaphoresis and fatigue.  Respiratory: Negative for chest tightness, shortness of breath and wheezing.   Cardiovascular: Negative for chest pain, palpitations and leg swelling.  Gastrointestinal: Negative for nausea, vomiting and diarrhea.  Musculoskeletal:       Left breast pain  Skin: Negative for rash.  Neurological: Negative for dizziness, weakness, numbness and headaches.  Psychiatric/Behavioral: The patient is not nervous/anxious.     Objective:  BP 100/62 mmHg  Pulse 75  Temp(Src) 98.9 F (37.2 C)  Resp 14  Ht  (1.702 m)  Wt 132 lb 3.2 oz (59.966 kg)   BMI 20.70 kg/m2  SpO2 98%  Physical Exam  Constitutional: She is oriented to person, place, and time. She appears well-developed and well-nourished. No distress.  HENT:  Head: Normocephalic and atraumatic.  Right Ear: External ear normal.  Left Ear: External ear normal.  Pulmonary/Chest: Effort normal.    Erythema, warmth, tenderness  Neurological: She is alert and oriented to person, place, and time. No cranial nerve deficit. She exhibits normal muscle tone. Coordination normal.  Skin: Skin is warm and dry. No rash noted. She is not diaphoretic.  Psychiatric: She has a normal mood and affect. Her behavior is normal. Judgment and thought content normal.   Assessment & Plan:   Tonyia was seen today for breast pain.  Diagnoses and all orders for this visit:  Mastitis  Other orders -     Cephalexin 500 MG tablet; Take 1 tablet (500 mg total) by mouth 4 (four) times daily.  I am having Ms. Hagy start on Cephalexin. I am also having her maintain her multivitamin-prenatal and nystatin cream.  Meds ordered this encounter  Medications  . Cephalexin 500 MG tablet    Sig: Take 1 tablet (500 mg total) by mouth 4 (four) times daily.    Dispense:  40 tablet    Refill:  0    Order Specific Question:  Supervising Provider    Answer:  Sherlene Shams [[id number]]     Follow-up: Return if symptoms worsen or fail to improve.

## 2015-01-20 NOTE — Patient Instructions (Signed)
Breastfeeding and Mastitis Mastitis is inflammation of the breast tissue. It can occur in women who are breastfeeding. This can make breastfeeding painful. Mastitis will sometimes go away on its own. Your health care provider will help determine if treatment is needed. CAUSES Mastitis is often associated with a blocked milk (lactiferous) duct. This can happen when too much milk builds up in the breast. Causes of excess milk in the breast can include:  Poor latch-on. If your baby is not latched onto the breast properly, she or he may not empty your breast completely while breastfeeding.  Allowing too much time to pass between feedings.  Wearing a bra or other clothing that is too tight. This puts extra pressure on the lactiferous ducts so milk does not flow through them as it should. Mastitis can also be caused by a bacterial infection. Bacteria may enter the breast tissue through cuts or openings in the skin. In women who are breastfeeding, this may occur because of cracked or irritated skin. Cracks in the skin are often caused when your baby does not latch on properly to the breast. SIGNS AND SYMPTOMS  Swelling, redness, tenderness, and pain in an area of the breast.  Swelling of the glands under the arm on the same side.  Fever may or may not accompany mastitis. If an infection is allowed to progress, a collection of pus (abscess) may develop. DIAGNOSIS  Your health care provider can usually diagnose mastitis based on your symptoms and a physical exam. Tests may be done to help confirm the diagnosis. These may include:  Removal of pus from the breast by applying pressure to the area. This pus can be examined in the lab to determine which bacteria are present. If an abscess has developed, the fluid in the abscess can be removed with a needle. This can also be used to confirm the diagnosis and determine the bacteria present. In most cases, pus will not be present.  Blood tests to determine if  your body is fighting a bacterial infection.  Mammogram or ultrasound tests to rule out other problems or diseases. TREATMENT  Mastitis that occurs with breastfeeding will sometimes go away on its own. Your health care provider may choose to wait 24 hours after first seeing you to decide whether a prescription medicine is needed. If your symptoms are worse after 24 hours, your health care provider will likely prescribe an antibiotic medicine to treat the mastitis. He or she will determine which bacteria are most likely causing the infection and will then select an appropriate antibiotic medicine. This is sometimes changed based on the results of tests performed to identify the bacteria, or if there is no response to the antibiotic medicine selected. Antibiotic medicines are usually given by mouth. You may also be given medicine for pain. HOME CARE INSTRUCTIONS  Only take over-the-counter or prescription medicines for pain, fever, or discomfort as directed by your health care provider.  If your health care provider prescribed an antibiotic medicine, take the medicine as directed. Make sure you finish it even if you start to feel better.  Do not wear a tight or underwire bra. Wear a soft, supportive bra.  Increase your fluid intake, especially if you have a fever.  Continue to empty the breast. Your health care provider can tell you whether this milk is safe for your infant or needs to be thrown out. You may be told to stop nursing until your health care provider thinks it is safe for your baby.   Use a breast pump if you are advised to stop nursing.  Keep your nipples clean and dry.  Empty the first breast completely before going to the other breast. If your baby is not emptying your breasts completely for some reason, use a breast pump to empty your breasts.  If you go back to work, pump your breasts while at work to stay in time with your nursing schedule.  Avoid allowing your breasts to become  overly filled with milk (engorged). SEEK MEDICAL CARE IF:  You have pus-like discharge from the breast.  Your symptoms do not improve with the treatment prescribed by your health care provider within 2 days. SEEK IMMEDIATE MEDICAL CARE IF:  Your pain and swelling are getting worse.  You have pain that is not controlled with medicine.  You have a red line extending from the breast toward your armpit.  You have a fever or persistent symptoms for more than 2-3 days.  You have a fever and your symptoms suddenly get worse. MAKE SURE YOU:   Understand these instructions.  Will watch your condition.  Will get help right away if you are not doing well or get worse.   This information is not intended to replace advice given to you by your health care provider. Make sure you discuss any questions you have with your health care provider.   Document Released: 07/29/2004 Document Revised: 04/08/2013 Document Reviewed: 11/07/2012 Elsevier Interactive Patient Education 2016 Elsevier Inc.  

## 2015-09-20 ENCOUNTER — Ambulatory Visit (INDEPENDENT_AMBULATORY_CARE_PROVIDER_SITE_OTHER): Payer: BLUE CROSS/BLUE SHIELD | Admitting: Family Medicine

## 2015-09-20 ENCOUNTER — Encounter: Payer: Self-pay | Admitting: Family Medicine

## 2015-09-20 VITALS — BP 115/76 | HR 67 | Temp 98.2°F | Ht 67.0 in | Wt 122.0 lb

## 2015-09-20 DIAGNOSIS — R599 Enlarged lymph nodes, unspecified: Secondary | ICD-10-CM

## 2015-09-20 DIAGNOSIS — R590 Localized enlarged lymph nodes: Secondary | ICD-10-CM | POA: Insufficient documentation

## 2015-09-20 NOTE — Assessment & Plan Note (Signed)
New problem. Very small node noted. No signs of infection. Advised active surveillance/watchful waiting. Supportive care.

## 2015-09-20 NOTE — Patient Instructions (Signed)
Keep an eye on the area.  If it does not resolve please let me know.  Take care  Dr. Adriana Simasook  Lymphadenopathy Lymphadenopathy refers to swollen or enlarged lymph glands, also called lymph nodes. Lymph glands are part of your body's defense (immune) system, which protects the body from infections, germs, and diseases. Lymph glands are found in many locations in your body, including the neck, underarm, and groin.  Many things can cause lymph glands to become enlarged. When your immune system responds to germs, such as viruses or bacteria, infection-fighting cells and fluid build up. This causes the glands to grow in size. Usually, this is not something to worry about. The swelling and any soreness often go away without treatment. However, swollen lymph glands can also be caused by a number of diseases. Your health care provider may do various tests to help determine the cause. If the cause of your swollen lymph glands cannot be found, it is important to monitor your condition to make sure the swelling goes away. HOME CARE INSTRUCTIONS Watch your condition for any changes. The following actions may help to lessen any discomfort you are feeling:  Get plenty of rest.  Take medicines only as directed by your health care provider. Your health care provider may recommend over-the-counter medicines for pain.  Apply moist heat compresses to the site of swollen lymph nodes as directed by your health care provider. This can help reduce any pain.  Check your lymph nodes daily for any changes.  Keep all follow-up visits as directed by your health care provider. This is important. SEEK MEDICAL CARE IF:  Your lymph nodes are still swollen after 2 weeks.  Your swelling increases or spreads to other areas.  Your lymph nodes are hard, seem fixed to the skin, or are growing rapidly.  Your skin over the lymph nodes is red and inflamed.  You have a fever.  You have chills.  You have fatigue.  You  develop a sore throat.  You have abdominal pain.  You have weight loss.  You have night sweats. SEEK IMMEDIATE MEDICAL CARE IF:  You notice fluid leaking from the area of the enlarged lymph node.  You have severe pain in any area of your body.  You have chest pain.  You have shortness of breath.   This information is not intended to replace advice given to you by your health care provider. Make sure you discuss any questions you have with your health care provider.   Document Released: 01/11/2008 Document Revised: 04/24/2014 Document Reviewed: 11/06/2013 Elsevier Interactive Patient Education [employer] Elsevier Inc.

## 2015-09-20 NOTE — Progress Notes (Signed)
   Subjective:  Patient ID: Sara MilchJennifer James, female    DOB: 1981-02-19  Age: 35 y.o. MRN: [id number]  CC: Pain in front of Left ear  HPI:  35 year old female presents with the above complaint.  Patient states that last week she developed an enlarged area in front of her left ear. She reports that he has been tender. She states that he continues to persist. She states that it is small. No known exacerbating or relieving factors. No recent URI or illness. No sore throat, runny nose, fever, chills. No weight loss. No other complaints at this time.  Social Hx   Social History   Social History  . Marital Status: Married    Spouse Name: N/A  . Number of Children: N/A  . Years of Education: N/A   Social History Main Topics  . Smoking status: Never Smoker   . Smokeless tobacco: Never Used  . Alcohol Use: 0.0 oz/week    0 Standard drinks or equivalent per week     Comment: 1 rarely   . Drug Use: No  . Sexual Activity:    Partners: Male     Comment: Husband   Other Topics Concern  . None   Social History Narrative   Stay at home mother    Master's degree- highest level    Lives with husband and 3 children    Pets: 1 dog    Caffeine- coffee 1 cup   Review of Systems  Constitutional: Negative for fever and chills.  HENT: Negative.    Objective:  BP 115/76 mmHg  Pulse 67  Temp(Src) 98.2 F (36.8 C) (Oral)  Ht 5\' 7"  (1.702 m)  Wt 122 lb (55.339 kg)  BMI 19.10 kg/m2  SpO2 100%  BP/Weight 09/20/2015 01/20/2015 12/23/2014  Systolic BP 115 100 104  Diastolic BP 76 62 84  Wt. (Lbs) 122 132.2 137  BMI 19.1 20.7 21.45   Physical Exam  Constitutional: She is oriented to person, place, and time. She appears well-developed. No distress.  HENT:  Head: Normocephalic and atraumatic.  Mouth/Throat: Oropharynx is clear and moist.  Normal TM's bilaterally.  Pulmonary/Chest: Effort normal.  Lymphadenopathy:       Head (left side): Preauricular adenopathy present.  Very small  preauricular node noted (Left).  Neurological: She is alert and oriented to person, place, and time.  Psychiatric: She has a normal mood and affect.  Vitals reviewed.  Lab Results  Component Value Date   WBC 12.5* 11/20/2014   HGB 12.1 11/20/2014   HCT 35.4 11/20/2014   PLT 175 11/20/2014   GLUCOSE 66 01/21/2012   ALT 13 01/21/2012   AST 22 01/21/2012   NA 137 01/21/2012   K 3.6 01/21/2012   CL 102 01/21/2012   CREATININE 0.56* 01/21/2012   BUN 8 01/21/2012   CO2 26 01/21/2012   Assessment & Plan:   Problem List Items Addressed This Visit    Preauricular lymphadenopathy - Primary    New problem. Very small node noted. No signs of infection. Advised active surveillance/watchful waiting. Supportive care.        Follow-up: Return if symptoms worsen or fail to improve.  Everlene OtherJayce Kaydin Karbowski DO [facility] [place]

## 2016-04-07 ENCOUNTER — Ambulatory Visit: Payer: BLUE CROSS/BLUE SHIELD | Admitting: Family Medicine

## 2016-04-07 ENCOUNTER — Ambulatory Visit: Payer: BLUE CROSS/BLUE SHIELD | Admitting: Internal Medicine

## 2016-05-16 ENCOUNTER — Ambulatory Visit (INDEPENDENT_AMBULATORY_CARE_PROVIDER_SITE_OTHER): Payer: Managed Care, Other (non HMO) | Admitting: Family

## 2016-05-16 ENCOUNTER — Encounter: Payer: Self-pay | Admitting: Family

## 2016-05-16 VITALS — BP 118/70 | HR 73 | Temp 98.2°F | Ht 67.0 in | Wt 122.6 lb

## 2016-05-16 DIAGNOSIS — R5383 Other fatigue: Secondary | ICD-10-CM

## 2016-05-16 DIAGNOSIS — Z23 Encounter for immunization: Secondary | ICD-10-CM | POA: Diagnosis not present

## 2016-05-16 LAB — CBC WITH DIFFERENTIAL/PLATELET
BASOS ABS: 0 10*3/uL (ref 0.0–0.1)
Basophils Relative: 0.5 % (ref 0.0–3.0)
Eosinophils Absolute: 0 10*3/uL (ref 0.0–0.7)
Eosinophils Relative: 0.8 % (ref 0.0–5.0)
HCT: 42.5 % (ref 36.0–46.0)
HEMOGLOBIN: 14.6 g/dL (ref 12.0–15.0)
LYMPHS ABS: 1.4 10*3/uL (ref 0.7–4.0)
Lymphocytes Relative: 31.9 % (ref 12.0–46.0)
MCHC: 34.4 g/dL (ref 30.0–36.0)
MCV: 91.2 fl (ref 78.0–100.0)
Monocytes Absolute: 0.4 10*3/uL (ref 0.1–1.0)
Monocytes Relative: 9.5 % (ref 3.0–12.0)
NEUTROS PCT: 57.3 % (ref 43.0–77.0)
Neutro Abs: 2.5 10*3/uL (ref 1.4–7.7)
Platelets: 216 10*3/uL (ref 150.0–400.0)
RBC: 4.66 Mil/uL (ref 3.87–5.11)
RDW: 12.9 % (ref 11.5–15.5)
WBC: 4.3 10*3/uL (ref 4.0–10.5)

## 2016-05-16 LAB — COMPREHENSIVE METABOLIC PANEL
ALT: 9 U/L (ref 0–35)
AST: 12 U/L (ref 0–37)
Albumin: 4.4 g/dL (ref 3.5–5.2)
Alkaline Phosphatase: 49 U/L (ref 39–117)
BUN: 13 mg/dL (ref 6–23)
CO2: 33 mEq/L — ABNORMAL HIGH (ref 19–32)
Calcium: 9.3 mg/dL (ref 8.4–10.5)
Chloride: 104 mEq/L (ref 96–112)
Creatinine, Ser: 0.68 mg/dL (ref 0.40–1.20)
GFR: 103.94 mL/min (ref >60.00)
GLUCOSE: 86 mg/dL (ref 70–99)
Potassium: 4.2 mEq/L (ref 3.5–5.1)
Sodium: 138 mEq/L (ref 135–145)
Total Bilirubin: 1.9 mg/dL — ABNORMAL HIGH (ref 0.2–1.2)
Total Protein: 6.9 g/dL (ref 6.0–8.3)

## 2016-05-16 LAB — VITAMIN D 25 HYDROXY (VIT D DEFICIENCY, FRACTURES): VITD: 25.88 ng/mL — ABNORMAL LOW (ref 30.00–100.00)

## 2016-05-16 LAB — TSH: TSH: 1.13 u[IU]/mL (ref 0.35–4.50)

## 2016-05-16 LAB — HEMOGLOBIN A1C: Hgb A1c MFr Bld: 5 % (ref 4.6–6.5)

## 2016-05-16 NOTE — Patient Instructions (Signed)
Labs today Pleasure meeting you 

## 2016-05-16 NOTE — Progress Notes (Signed)
Pre visit review using our clinic review tool, if applicable. No additional management support is needed unless otherwise documented below in the visit note. 

## 2016-05-16 NOTE — Progress Notes (Signed)
Subjective:    Patient ID: Sara James, female    DOB: [birth year], 36 y.o.   MRN: [id number]  CC: Sara James is a 36 y.o. female who presents today for an acute visit.    HPI: CC: fatigue past couple of months, unchanged. Youngest child just started sleeping through the night. Also endorses knees are 'achey' on occasion. Did find a tick bite several months ago, no 'bulls eye rash at that time.'  Goes to bed 10am and gets up 5:30, 7/7.5 hours. Feels okay in the morning; noticed feeling tired midmorning. Several cups of coffee.  No anxiety, depression.   Recent pregnancy, still breastfeeding         HISTORY:  Past Medical History:  Diagnosis Date  . Chicken pox   . Gestational diabetes    G1  . Migraines    hemiplegic migraines with G2   Past Surgical History:  Procedure Laterality Date  . BUNIONECTOMY  2009   bilateral   Family History  Problem Relation Age of Onset  . Diabetes Maternal Grandmother   . Diabetes Paternal Grandmother   . Cancer Paternal Grandmother     kidney  . Heart disease Paternal Grandfather   . Heart disease Father   . Hypertension Father     Allergies: Patient has no known allergies. No current outpatient prescriptions on file prior to visit.   No current facility-administered medications on file prior to visit.     Social History  Substance Use Topics  . Smoking status: Never Smoker  . Smokeless tobacco: Never Used  . Alcohol use 0.0 oz/week     Comment: 1 rarely     Review of Systems  Constitutional: Positive for fatigue. Negative for chills and fever.  Respiratory: Negative for cough.   Cardiovascular: Negative for chest pain and palpitations.  Gastrointestinal: Negative for abdominal distention, nausea and vomiting.  Musculoskeletal: Positive for arthralgias (occasional).  Skin: Negative for color change, rash and wound.  Neurological: Negative for dizziness.      Objective:    BP 118/70   Pulse 73   Temp 98.2  F (36.8 C) (Oral)   Ht 5\' 7"  (1.702 m)   Wt 122 lb 9.6 oz (55.6 kg)   LMP 05/02/2016 (Exact Date)   SpO2 97%   Breastfeeding? Yes   BMI 19.20 kg/m    Physical Exam  Constitutional: She appears well-developed and well-nourished.  Eyes: Conjunctivae are normal.  Neck: No thyroid mass and no thyromegaly present.  Cardiovascular: Normal rate, regular rhythm, normal heart sounds and normal pulses.   Pulmonary/Chest: Effort normal and breath sounds normal. She has no wheezes. She has no rhonchi. She has no rales.  Lymphadenopathy:       Head (right side): No submental, no submandibular, no tonsillar, no preauricular, no posterior auricular and no occipital adenopathy present.       Head (left side): No submental, no submandibular, no tonsillar, no preauricular, no posterior auricular and no occipital adenopathy present.    She has no cervical adenopathy.  Neurological: She is alert.  Skin: Skin is warm and dry.  Psychiatric: She has a normal mood and affect. Her speech is normal and behavior is normal. Thought content normal.  Vitals reviewed.      Assessment & Plan:   Problem List Items Addressed This Visit      Other   Fatigue - Primary    Recent pregnancy. Pending TSH, CBC. Tick exposure, pending lyme titer. Will follow  Relevant Orders   CBC with Differential/Platelet   Comprehensive metabolic panel   Hemoglobin A1c   TSH   VITAMIN D 25 Hydroxy (Vit-D Deficiency, Fractures)   Lyme Ab/Western Blot Reflex        Ms. Zappulla does not currently have medications on file.   No orders of the defined types were placed in this encounter.   Return precautions given.   Risks, benefits, and alternatives of the medications and treatment plan prescribed today were discussed, and patient expressed understanding.   Education regarding symptom management and diagnosis given to patient on AVS.  Continue to follow with Rennie PlowmanMargaret Kastin Cerda, FNP for routine health maintenance.    Daralene MilchJennifer Skowronek and I agreed with plan.   Rennie PlowmanMargaret Suzi Hernan, FNP

## 2016-05-16 NOTE — Assessment & Plan Note (Addendum)
Recent pregnancy, still breastfeeding. Considering thyroid disease, anemia after pregnancy. No concern for depression at this time.Pending TSH, CBC. Tick exposure, pending lyme titer. Will follow

## 2016-05-17 LAB — LYME AB/WESTERN BLOT REFLEX

## 2016-05-18 ENCOUNTER — Other Ambulatory Visit: Payer: Self-pay | Admitting: Family

## 2016-05-18 DIAGNOSIS — R17 Unspecified jaundice: Secondary | ICD-10-CM

## 2016-05-22 ENCOUNTER — Other Ambulatory Visit (INDEPENDENT_AMBULATORY_CARE_PROVIDER_SITE_OTHER): Payer: Managed Care, Other (non HMO)

## 2016-05-22 DIAGNOSIS — R17 Unspecified jaundice: Secondary | ICD-10-CM

## 2016-05-22 LAB — HEPATIC FUNCTION PANEL
ALT: 21 U/L (ref 0–35)
AST: 22 U/L (ref 0–37)
Albumin: 4.8 g/dL (ref 3.5–5.2)
Alkaline Phosphatase: 49 U/L (ref 39–117)
BILIRUBIN DIRECT: 0.2 mg/dL (ref 0.0–0.3)
TOTAL PROTEIN: 7.7 g/dL (ref 6.0–8.3)
Total Bilirubin: 1.5 mg/dL — ABNORMAL HIGH (ref 0.2–1.2)

## 2016-05-23 ENCOUNTER — Other Ambulatory Visit: Payer: Self-pay | Admitting: Family

## 2016-06-13 ENCOUNTER — Ambulatory Visit: Payer: Managed Care, Other (non HMO) | Admitting: Family Medicine

## 2016-06-13 DIAGNOSIS — Z0289 Encounter for other administrative examinations: Secondary | ICD-10-CM

## 2016-06-14 ENCOUNTER — Encounter: Payer: Self-pay | Admitting: Family Medicine

## 2016-06-14 ENCOUNTER — Telehealth: Payer: Self-pay | Admitting: Radiology

## 2016-06-14 ENCOUNTER — Ambulatory Visit (INDEPENDENT_AMBULATORY_CARE_PROVIDER_SITE_OTHER): Payer: Managed Care, Other (non HMO) | Admitting: Family Medicine

## 2016-06-14 VITALS — BP 108/75 | HR 83 | Temp 97.7°F | Wt 122.4 lb

## 2016-06-14 DIAGNOSIS — N3001 Acute cystitis with hematuria: Secondary | ICD-10-CM | POA: Insufficient documentation

## 2016-06-14 LAB — POCT URINALYSIS DIPSTICK
Bilirubin, UA: NEGATIVE
Glucose, UA: NEGATIVE
Ketones, UA: NEGATIVE
Nitrite, UA: NEGATIVE
PH UA: 6
PROTEIN UA: 30
SPEC GRAV UA: 1.015
UROBILINOGEN UA: 1

## 2016-06-14 MED ORDER — CEPHALEXIN 500 MG PO CAPS: 500.0000 mg | ORAL_CAPSULE | Freq: Two times a day (BID) | ORAL | 0 refills | Status: DC

## 2016-06-14 NOTE — Telephone Encounter (Signed)
Its okay. She was already on antibiotics.

## 2016-06-14 NOTE — Progress Notes (Signed)
   Subjective:  Patient ID: Sara James, female    DOB: 05-10-1980  Age: 36 y.o. MRN: [id number]  CC: UTI  HPI:  36 year old female presents with concern for UTI.  Patient symptoms started Monday. She's had frequency, urgency, and dysuria. She's also had gross hematuria. No associated fever. She reports some associated low back pain. No flank pain. She has taken Azo and left over Keflex with improvement. No other associated symptoms. No other complaints or concerns at this time.  Social Hx   Social History   Social History  . Marital status: Married    Spouse name: N/A  . Number of children: N/A  . Years of education: N/A   Social History Main Topics  . Smoking status: Never Smoker  . Smokeless tobacco: Never Used  . Alcohol use 0.0 oz/week     Comment: 1 rarely   . Drug use: No  . Sexual activity: Yes    Partners: Male     Comment: Husband   Other Topics Concern  . None   Social History Narrative   Stay at home mother    Master's degree- highest level    Lives with husband and 3 children -   6,4, and 18 months.    Pets: 1 dog    Caffeine- coffee 1 cup    Review of Systems  Constitutional: Negative for fever.  Genitourinary: Positive for dysuria, frequency, hematuria and urgency. Negative for flank pain.  Musculoskeletal: Positive for back pain.   Objective:  BP 108/75   Pulse 83   Temp 97.7 F (36.5 C) (Oral)   Wt 122 lb 6.4 oz (55.5 kg)   SpO2 97%   BMI 19.17 kg/m   BP/Weight 06/14/2016 05/16/2016 09/20/2015  Systolic BP 108 118 115  Diastolic BP 75 70 76  Wt. (Lbs) 122.4 122.6 122  BMI 19.17 19.2 19.1   Physical Exam  Constitutional: She is oriented to person, place, and time. She appears well-developed. No distress.  Cardiovascular: Normal rate and regular rhythm.   Pulmonary/Chest: Effort normal and breath sounds normal.  Abdominal: Soft. She exhibits no distension. There is no tenderness.  Neurological: She is alert and oriented to person,  place, and time.  Psychiatric: She has a normal mood and affect.  Vitals reviewed.   Lab Results  Component Value Date   WBC 4.3 05/16/2016   HGB 14.6 05/16/2016   HCT 42.5 05/16/2016   PLT 216.0 05/16/2016   GLUCOSE 86 05/16/2016   ALT 21 05/22/2016   AST 22 05/22/2016   NA 138 05/16/2016   K 4.2 05/16/2016   CL 104 05/16/2016   CREATININE 0.68 05/16/2016   BUN 13 05/16/2016   CO2 33 (H) 05/16/2016   TSH 1.13 05/16/2016   HGBA1C 5.0 05/16/2016    Assessment & Plan:   Problem List Items Addressed This Visit    Acute cystitis with hematuria - Primary    New acute problem. History consistent with UTI. Treating empirically with Keflex. Obtaining culture.      Relevant Orders   POCT Urinalysis Dipstick (Completed)      Meds ordered this encounter  Medications  . cephALEXin (KEFLEX) 500 MG capsule    Sig: Take 1 capsule (500 mg total) by mouth 2 (two) times daily.    Dispense:  10 capsule    Refill:  0    Follow-up: PRN  Everlene OtherJayce Ellia Knowlton DO [facility] [place]

## 2016-06-14 NOTE — Addendum Note (Signed)
Addended by: Penne LashWIGGINS, Giavanna Kang N on: 06/14/2016 12:50 PM   Modules accepted: Orders

## 2016-06-14 NOTE — Progress Notes (Signed)
Pre visit review using our clinic review tool, if applicable. No additional management support is needed unless otherwise documented below in the visit note. 

## 2016-06-14 NOTE — Patient Instructions (Signed)
Take the keflex 2 times daily.  Take care  Dr. Adriana Simasook

## 2016-06-14 NOTE — Assessment & Plan Note (Signed)
New acute problem. History consistent with UTI. Treating empirically with Keflex. Obtaining culture.

## 2016-06-14 NOTE — Addendum Note (Signed)
Addended by: Jennelle HumanNEWTON, ASHLEIGH E on: 06/14/2016 10:56 AM   Modules accepted: Orders

## 2016-06-14 NOTE — Telephone Encounter (Signed)
Pt did not have enough urine to do culture, would you like pt to come back?

## 2016-06-15 ENCOUNTER — Telehealth: Payer: Self-pay | Admitting: Family

## 2016-06-15 ENCOUNTER — Other Ambulatory Visit: Payer: Self-pay | Admitting: Family Medicine

## 2016-06-15 MED ORDER — CIPROFLOXACIN HCL 500 MG PO TABS: 500.0000 mg | ORAL_TABLET | Freq: Two times a day (BID) | ORAL | 0 refills | Status: DC

## 2016-06-15 NOTE — Telephone Encounter (Signed)
Patient has been notified of medication change

## 2016-06-15 NOTE — Telephone Encounter (Signed)
Pt was diagnosed with UTI yesterday and was given Keflex. States she woke up really sick this morning. She thinks it has turned into a kidney infection. Wants to know if the Keflex will treat a kidney infection as well. Pt cb [phone].

## 2016-06-15 NOTE — Telephone Encounter (Signed)
Need to switch to cipro. Sending in.

## 2016-06-15 NOTE — Telephone Encounter (Signed)
Patient started keflex yesterday afternoon, but around 1.30 patient started to run a fever of 102 and having share back pain rated at 6 on scale 0-10, patient still has fever this morning with Nausea . Fever will subside with Advil 400 mg but comes back in about 6 hours, patient concerned if infection has gone to kidney will keflex still be the right treatment for her to follow for kidney infection. Patient seen by Dr. Adriana Simasook on 06/14/16.

## 2016-12-20 ENCOUNTER — Encounter: Payer: Self-pay | Admitting: Family

## 2016-12-25 ENCOUNTER — Telehealth: Payer: Self-pay

## 2016-12-25 NOTE — Telephone Encounter (Signed)
Comfortable with her not being seen if better If returns, have her make an appt  Appreciate update!

## 2016-12-25 NOTE — Telephone Encounter (Signed)
Called patient to see how she was doing and patient says that she no longer has the redness or soreness and the lump is getting smaller. No acute symptoms and breast is no longer tender. She was wanting to know if you think she needs to be seen or if she should continue to monitor since symptoms are getting better and not worse.

## 2017-03-09 ENCOUNTER — Encounter: Payer: Self-pay | Admitting: Family Medicine

## 2017-03-09 ENCOUNTER — Ambulatory Visit: Payer: Managed Care, Other (non HMO) | Admitting: Family Medicine

## 2017-03-09 VITALS — BP 98/60 | HR 62 | Temp 98.0°F | Ht 67.0 in | Wt 130.8 lb

## 2017-03-09 DIAGNOSIS — R300 Dysuria: Secondary | ICD-10-CM | POA: Diagnosis not present

## 2017-03-09 DIAGNOSIS — R350 Frequency of micturition: Secondary | ICD-10-CM | POA: Diagnosis not present

## 2017-03-09 LAB — POCT URINALYSIS DIPSTICK
Bilirubin, UA: NEGATIVE
GLUCOSE UA: NEGATIVE
Ketones, UA: NEGATIVE
Leukocytes, UA: NEGATIVE
NITRITE UA: NEGATIVE
PH UA: 6.5 (ref 5.0–8.0)
Protein, UA: NEGATIVE
RBC UA: 1
UROBILINOGEN UA: 0.2 U/dL

## 2017-03-09 MED ORDER — CIPROFLOXACIN HCL 500 MG PO TABS: 500.0000 mg | ORAL_TABLET | Freq: Two times a day (BID) | ORAL | 0 refills | Status: DC

## 2017-03-09 NOTE — Progress Notes (Signed)
   Subjective:    Patient ID: Sara James, female    DOB: [birth year], 36 y.o.   MRN: [id number]  HPI UTI- pt reports sxs started ~2 weeks ago.  She took leftover Keflex w/o improvement.  Took home AZO urine test that showed blood, high leuks, and high nitrites.  Increased frequency, urgency, dysuria, urinary odor.  + hx of UTIs.  No fevers.  No CVA tenderness.  No N/V.   Review of Systems For ROS see HPI     Objective:   Physical Exam  Constitutional: She is oriented to person, place, and time. She appears well-developed and well-nourished. No distress.  Abdominal: Soft. She exhibits no distension. There is tenderness (+ suprapubic but no CVA tenderness ).  Neurological: She is alert and oriented to person, place, and time.  Skin: Skin is warm and dry.  Psychiatric: She has a normal mood and affect. Her behavior is normal. Thought content normal.  Vitals reviewed.         Assessment & Plan:  UTI- new to provider, hx of similar for pt.  No improvement w/ Keflex, will switch to Cipro as pt is no longer breastfeeding.  Reviewed supportive care and red flags that should prompt return.  Pt expressed understanding and is in agreement w/ plan.

## 2017-03-09 NOTE — Patient Instructions (Signed)
Follow up as needed or as scheduled Start the Cipro twice daily x3 days- take w/ food Drink plenty of fluids AZO as needed for pain/burning Call with any questions or concerns Hang in there!!!

## 2018-03-04 ENCOUNTER — Encounter: Payer: Self-pay | Admitting: Family

## 2018-03-04 ENCOUNTER — Ambulatory Visit: Payer: BLUE CROSS/BLUE SHIELD | Admitting: Family

## 2018-03-04 VITALS — BP 110/70 | HR 68 | Temp 98.3°F | Resp 15 | Ht 67.0 in | Wt 129.0 lb

## 2018-03-04 DIAGNOSIS — R519 Headache, unspecified: Secondary | ICD-10-CM | POA: Insufficient documentation

## 2018-03-04 DIAGNOSIS — R51 Headache: Secondary | ICD-10-CM | POA: Diagnosis not present

## 2018-03-04 DIAGNOSIS — Z23 Encounter for immunization: Secondary | ICD-10-CM

## 2018-03-04 DIAGNOSIS — R197 Diarrhea, unspecified: Secondary | ICD-10-CM | POA: Diagnosis not present

## 2018-03-04 DIAGNOSIS — Z0000 Encounter for general adult medical examination without abnormal findings: Secondary | ICD-10-CM

## 2018-03-04 LAB — CBC WITH DIFFERENTIAL/PLATELET
BASOS ABS: 0 10*3/uL (ref 0.0–0.1)
BASOS PCT: 0.5 % (ref 0.0–3.0)
EOS ABS: 0 10*3/uL (ref 0.0–0.7)
Eosinophils Relative: 1 % (ref 0.0–5.0)
HCT: 39.6 % (ref 36.0–46.0)
HEMOGLOBIN: 13.7 g/dL (ref 12.0–15.0)
Lymphocytes Relative: 31.2 % (ref 12.0–46.0)
Lymphs Abs: 1.4 10*3/uL (ref 0.7–4.0)
MCHC: 34.5 g/dL (ref 30.0–36.0)
MCV: 92.4 fl (ref 78.0–100.0)
MONO ABS: 0.5 10*3/uL (ref 0.1–1.0)
Monocytes Relative: 11.6 % (ref 3.0–12.0)
Neutro Abs: 2.5 10*3/uL (ref 1.4–7.7)
Neutrophils Relative %: 55.7 % (ref 43.0–77.0)
Platelets: 221 10*3/uL (ref 150.0–400.0)
RBC: 4.29 Mil/uL (ref 3.87–5.11)
RDW: 12.6 % (ref 11.5–15.5)
WBC: 4.5 10*3/uL (ref 4.0–10.5)

## 2018-03-04 LAB — COMPREHENSIVE METABOLIC PANEL
ALBUMIN: 4.5 g/dL (ref 3.5–5.2)
ALT: 17 U/L (ref 0–35)
AST: 22 U/L (ref 0–37)
Alkaline Phosphatase: 49 U/L (ref 39–117)
BUN: 18 mg/dL (ref 6–23)
CHLORIDE: 103 meq/L (ref 96–112)
CO2: 27 mEq/L (ref 19–32)
CREATININE: 0.78 mg/dL (ref 0.40–1.20)
Calcium: 9.6 mg/dL (ref 8.4–10.5)
GFR: 87.85 mL/min (ref >60.00)
Glucose, Bld: 85 mg/dL (ref 70–99)
Potassium: 4.1 mEq/L (ref 3.5–5.1)
SODIUM: 138 meq/L (ref 135–145)
Total Bilirubin: 1.3 mg/dL — ABNORMAL HIGH (ref 0.2–1.2)
Total Protein: 7.4 g/dL (ref 6.0–8.3)

## 2018-03-04 LAB — LIPID PANEL
CHOL/HDL RATIO: 2
Cholesterol: 161 mg/dL (ref 0–200)
HDL: 69.5 mg/dL (ref >39.00)
LDL CALC: 84 mg/dL (ref 0–99)
NonHDL: 91.04
TRIGLYCERIDES: 34 mg/dL (ref 0.0–149.0)
VLDL: 6.8 mg/dL (ref 0.0–40.0)

## 2018-03-04 LAB — HEMOGLOBIN A1C: HEMOGLOBIN A1C: 5.1 % (ref 4.6–6.5)

## 2018-03-04 LAB — TSH: TSH: 1.16 u[IU]/mL (ref 0.35–4.50)

## 2018-03-04 LAB — VITAMIN D 25 HYDROXY (VIT D DEFICIENCY, FRACTURES): VITD: 32.46 ng/mL (ref 30.00–100.00)

## 2018-03-04 NOTE — Assessment & Plan Note (Signed)
Chronic. Pending celiac panel. Referral to GI. Will follow.

## 2018-03-04 NOTE — Progress Notes (Signed)
Subjective:    Patient ID: Sara MilchJennifer James, female    DOB: Apr 02, 1981, 37 y.o.   MRN: [id number]  CC: Sara James is a 37 y.o. female who presents today for physical exam.    HPI: Complains of non bloody diarrhea, daily, for years 'as long as I can remember'. Intermittent.  Thinks lactose intolerant which worsening over the past couple of years. Thinks IBS- diarrhea. Lactaid pills with some relief. Weight stable.   Wanted to know if there was anything else she should do for her headaches. No headache today.  In second pregnancy reports had 'stroke like HA' in which had left arm numbness, vision changes; no symptoms as this since 2013. Was seen by neurology at that time.  Describes monthly HA as  Dull frontal. NO HA today. Excredin migraine with resolve.  Occur before or during menses. No aura. Endorses nausea. No vomiting, vision changes.        Colorectal Cancer Screening: deferr Breast Cancer Screening: baseline mammogram Cervical Cancer Screening: due; follows with OB GYN        Tetanus - utd  Labs: Screening labs today. Exercise: Gets regular exercise.  Alcohol use: rarely Smoking/tobacco use: Nonsmoker.  Regular dental exams: UTD Wears seat belt: Yes. Skin: no skin cancer  HISTORY:  Past Medical History:  Diagnosis Date  . Chicken pox   . Gestational diabetes    G1  . Migraines    hemiplegic migraines with G2    Past Surgical History:  Procedure Laterality Date  . BUNIONECTOMY  2009   bilateral   Family History  Problem Relation Age of Onset  . Diabetes Maternal Grandmother   . Heart disease Paternal Grandfather   . Heart disease Father   . Hypertension Father   . Diabetes Paternal Grandmother   . Cancer Paternal Grandmother        kidney  . Pancreatic cancer Maternal Aunt   . Colon cancer Neg Hx       ALLERGIES: Patient has no known allergies.  Current Outpatient Medications on File Prior to Visit  Medication Sig Dispense Refill  .  Multiple Vitamins-Minerals (MULTIVITAMIN/EXTRA VITAMIN D3 PO) Take by mouth daily.     No current facility-administered medications on file prior to visit.     Social History   Tobacco Use  . Smoking status: Never Smoker  . Smokeless tobacco: Never Used  Substance Use Topics  . Alcohol use: Yes    Alcohol/week: 0.0 standard drinks    Comment: 1 rarely   . Drug use: No    Review of Systems  Constitutional: Negative for chills, fever and unexpected weight change.  HENT: Negative for congestion.   Eyes: Negative for visual disturbance.  Respiratory: Negative for cough.   Cardiovascular: Negative for chest pain, palpitations and leg swelling.  Gastrointestinal: Positive for diarrhea. Negative for abdominal distention, abdominal pain, blood in stool, constipation, nausea and vomiting.  Genitourinary: Negative for dyspareunia and vaginal pain.  Musculoskeletal: Negative for arthralgias and myalgias.  Skin: Negative for rash.  Neurological: Positive for headaches. Negative for dizziness and numbness.  Hematological: Negative for adenopathy.  Psychiatric/Behavioral: Negative for confusion.      Objective:    BP 110/70 (BP Location: Left Arm, Patient Position: Sitting, Cuff Size: Normal)   Pulse 68   Temp 98.3 F (36.8 C) (Oral)   Resp 15   Ht 5\' 7"  (1.702 m)   Wt 129 lb (58.5 kg)   LMP 03/02/2018   SpO2 98%   BMI  20.20 kg/m   BP Readings from Last 3 Encounters:  03/04/18 110/70  03/09/17 98/60  06/14/16 108/75   Wt Readings from Last 3 Encounters:  03/04/18 129 lb (58.5 kg)  03/09/17 130 lb 12 oz (59.3 kg)  06/14/16 122 lb 6.4 oz (55.5 kg)    Physical Exam  Constitutional: She appears well-developed and well-nourished.  Eyes: Conjunctivae are normal.  Neck: No thyroid mass and no thyromegaly present.  Cardiovascular: Normal rate, regular rhythm, normal heart sounds and normal pulses.  Pulmonary/Chest: Effort normal and breath sounds normal. She has no wheezes.  She has no rhonchi. She has no rales. Right breast exhibits no inverted nipple, no mass, no nipple discharge, no skin change and no tenderness. Left breast exhibits no inverted nipple, no mass, no nipple discharge, no skin change and no tenderness. Breasts are symmetrical.  CBE performed.   Lymphadenopathy:       Head (right side): No submental, no submandibular, no tonsillar, no preauricular, no posterior auricular and no occipital adenopathy present.       Head (left side): No submental, no submandibular, no tonsillar, no preauricular, no posterior auricular and no occipital adenopathy present.    She has no cervical adenopathy.       Right cervical: No superficial cervical, no deep cervical and no posterior cervical adenopathy present.      Left cervical: No superficial cervical, no deep cervical and no posterior cervical adenopathy present.    She has no axillary adenopathy.  Neurological: She is alert.  Skin: Skin is warm and dry.  Psychiatric: She has a normal mood and affect. Her speech is normal and behavior is normal. Thought content normal.  Vitals reviewed.      Assessment & Plan:   Problem List Items Addressed This Visit      Other   Routine physical examination - Primary    Clinical breast exam performed today.  Advised patient that she should pursue a baseline mammogram.  I ordered this today and she will call her insurance company as well as schedule this at [employer].  Referral placed dermatology for annual skin check.  Deferred pelvic exam in the absence of complaints and as patient follows with GYN and plans to make an appointment for her Pap smear.      Relevant Orders   Ambulatory referral to Gastroenterology   Ambulatory referral to Dermatology   Celiac Disease Ab Screen w/Rfx   MM 3D SCREEN BREAST BILATERAL   CBC with Differential/Platelet   Comprehensive metabolic panel   Hemoglobin A1c   TSH   VITAMIN D 25 Hydroxy (Vit-D Deficiency, Fractures)   Lipid panel     Diarrhea    Chronic. Pending celiac panel. Referral to GI. Will follow.       Relevant Orders   Ambulatory referral to Gastroenterology   Celiac Disease Ab Screen w/Rfx   Nonintractable headache    No headache today.  Discussed with patient her prior history of strokelike headache, advised against a triptan at this time.  Furthermore, her headaches occur monthly, and resolve with Excedrin.  They have not changed in frequency, severity or presentation.  Advised her of any of the latter were to change, she would let me know immediately.  Patient verbalized understanding.  Will follow       Other Visit Diagnoses    Need for immunization against influenza       Relevant Orders   Flu Vaccine QUAD 36+ mos IM (Completed)  I have discontinued Sara James's ciprofloxacin. I am also having her maintain her Multiple Vitamins-Minerals (MULTIVITAMIN/EXTRA VITAMIN D3 PO).   No orders of the defined types were placed in this encounter.   Return precautions given.   Risks, benefits, and alternatives of the medications and treatment plan prescribed today were discussed, and patient expressed understanding.   Education regarding symptom management and diagnosis given to patient on AVS.   Continue to follow with Allegra Grana, FNP for routine health maintenance.   Sara James and I agreed with plan.   Rennie Plowman, FNP

## 2018-03-04 NOTE — Patient Instructions (Addendum)
Today we discussed referrals, orders. Gastroenterology, Dermatology   I have placed these orders in the system for you.  Please be sure to give [place] a call if you have not heard from our office regarding this. We should hear from [place] within ONE week with information regarding your appointment. If not, please let me know immediately.      Let me know if headaches were to worsen, increase in frequency, severity or change in presentation.   We placed a referral for BASELINE mammogram this year. I would call your insurance prior to ensure they cover.  I would like you to ask for 3D mammogram over the traditional 2D mammogram as new evidence suggest 3D is superior.    [place]  [place], [place] Maintenance, Female Adopting a healthy lifestyle and getting preventive care can go a long way to promote health and wellness. Talk with your health care provider about what schedule of regular examinations is right for you. This is a good chance for you to check in with your provider about disease prevention and staying healthy. In between checkups, there are plenty of things you can do on your own. Experts have done a lot of research about which lifestyle changes and preventive measures are most likely to keep you healthy. Ask your health care provider for more information. Weight and diet Eat a healthy diet  Be sure to include plenty of vegetables, fruits, low-fat dairy products, and lean protein.  Do not eat a lot of foods high in solid fats, added sugars, or salt.  Get regular exercise. This is one of the most important things you can do for your health. ? Most adults should exercise for at least 150 minutes each week. The exercise should increase your heart rate and make you sweat (moderate-intensity exercise). ? Most adults should also do strengthening exercises at least twice a week. This is in addition to the moderate-intensity  exercise.  Maintain a healthy weight  Body mass index (BMI) is a measurement that can be used to identify possible weight problems. It estimates body fat based on height and weight. Your health care provider can help determine your BMI and help you achieve or maintain a healthy weight.  For females 20 years of age and older: ? A BMI below 18.5 is considered underweight. ? A BMI of 18.5 to 24.9 is normal. ? A BMI of 25 to 29.9 is considered overweight. ? A BMI of 30 and above is considered obese.  Watch levels of cholesterol and blood lipids  You should start having your blood tested for lipids and cholesterol at 38 years of age, then have this test every 5 years.  You may need to have your cholesterol levels checked more often if: ? Your lipid or cholesterol levels are high. ? You are older than 37 years of age. ? You are at high risk for heart disease.  Cancer screening Lung Cancer  Lung cancer screening is recommended for adults 55-70 years old who are at high risk for lung cancer because of a history of smoking.  A yearly low-dose CT scan of the lungs is recommended for people who: ? Currently smoke. ? Have quit within the past 15 years. ? Have at least a 30-pack-year history of smoking. A pack year is smoking an average of one pack of cigarettes a day for 1 year.  Yearly screening should continue until it has been  15 years since you quit.  Yearly screening should stop if you develop a health problem that would prevent you from having lung cancer treatment.  Breast Cancer  Practice breast self-awareness. This means understanding how your breasts normally appear and feel.  It also means doing regular breast self-exams. Let your health care provider know about any changes, no matter how small.  If you are in your 20s or 30s, you should have a clinical breast exam (CBE) by a health care provider every 1-3 years as part of a regular health exam.  If you are 72 or older, have  a CBE every year. Also consider having a breast X-ray (mammogram) every year.  If you have a family history of breast cancer, talk to your health care provider about genetic screening.  If you are at high risk for breast cancer, talk to your health care provider about having an MRI and a mammogram every year.  Breast cancer gene (BRCA) assessment is recommended for women who have family members with BRCA-related cancers. BRCA-related cancers include: ? Breast. ? Ovarian. ? Tubal. ? Peritoneal cancers.  Results of the assessment will determine the need for genetic counseling and BRCA1 and BRCA2 testing.  Cervical Cancer Your health care provider may recommend that you be screened regularly for cancer of the pelvic organs (ovaries, uterus, and vagina). This screening involves a pelvic examination, including checking for microscopic changes to the surface of your cervix (Pap test). You may be encouraged to have this screening done every 3 years, beginning at age 3.  For women ages 69-65, health care providers may recommend pelvic exams and Pap testing every 3 years, or they may recommend the Pap and pelvic exam, combined with testing for human papilloma virus (HPV), every 5 years. Some types of HPV increase your risk of cervical cancer. Testing for HPV may also be done on women of any age with unclear Pap test results.  Other health care providers may not recommend any screening for nonpregnant women who are considered low risk for pelvic cancer and who do not have symptoms. Ask your health care provider if a screening pelvic exam is right for you.  If you have had past treatment for cervical cancer or a condition that could lead to cancer, you need Pap tests and screening for cancer for at least 20 years after your treatment. If Pap tests have been discontinued, your risk factors (such as having a new sexual partner) need to be reassessed to determine if screening should resume. Some women have  medical problems that increase the chance of getting cervical cancer. In these cases, your health care provider may recommend more frequent screening and Pap tests.  Colorectal Cancer  This type of cancer can be detected and often prevented.  Routine colorectal cancer screening usually begins at 38 years of age and continues through 38 years of age.  Your health care provider may recommend screening at an earlier age if you have risk factors for colon cancer.  Your health care provider may also recommend using home test kits to check for hidden blood in the stool.  A small camera at the end of a tube can be used to examine your colon directly (sigmoidoscopy or colonoscopy). This is done to check for the earliest forms of colorectal cancer.  Routine screening usually begins at age 19.  Direct examination of the colon should be repeated every 5-10 years through 38 years of age. However, you may need to be screened more  often if early forms of precancerous polyps or small growths are found.  Skin Cancer  Check your skin from head to toe regularly.  Tell your health care provider about any new moles or changes in moles, especially if there is a change in a mole's shape or color.  Also tell your health care provider if you have a mole that is larger than the size of a pencil eraser.  Always use sunscreen. Apply sunscreen liberally and repeatedly throughout the day.  Protect yourself by wearing long sleeves, pants, a wide-brimmed hat, and sunglasses whenever you are outside.  Heart disease, diabetes, and high blood pressure  High blood pressure causes heart disease and increases the risk of stroke. High blood pressure is more likely to develop in: ? People who have blood pressure in the high end of the normal range (130-139/85-89 mm Hg). ? People who are overweight or obese. ? People who are African American.  If you are 95-73 years of age, have your blood pressure checked every 3-5  years. If you are 40 years of age or older, have your blood pressure checked every year. You should have your blood pressure measured twice-once when you are at a hospital or clinic, and once when you are not at a hospital or clinic. Record the average of the two measurements. To check your blood pressure when you are not at a hospital or clinic, you can use: ? An automated blood pressure machine at a pharmacy. ? A home blood pressure monitor.  If you are between 73 years and 86 years old, ask your health care provider if you should take aspirin to prevent strokes.  Have regular diabetes screenings. This involves taking a blood sample to check your fasting blood sugar level. ? If you are at a normal weight and have a low risk for diabetes, have this test once every three years after 38 years of age. ? If you are overweight and have a high risk for diabetes, consider being tested at a younger age or more often. Preventing infection Hepatitis B  If you have a higher risk for hepatitis B, you should be screened for this virus. You are considered at high risk for hepatitis B if: ? You were born in a country where hepatitis B is common. Ask your health care provider which countries are considered high risk. ? Your parents were born in a high-risk country, and you have not been immunized against hepatitis B (hepatitis B vaccine). ? You have HIV or AIDS. ? You use needles to inject street drugs. ? You live with someone who has hepatitis B. ? You have had sex with someone who has hepatitis B. ? You get hemodialysis treatment. ? You take certain medicines for conditions, including cancer, organ transplantation, and autoimmune conditions.  Hepatitis C  Blood testing is recommended for: ? Everyone born from [birth year] through 1966. ? Anyone with known risk factors for hepatitis C.  Sexually transmitted infections (STIs)  You should be screened for sexually transmitted infections (STIs) including  gonorrhea and chlamydia if: ? You are sexually active and are younger than 38 years of age. ? You are older than 38 years of age and your health care provider tells you that you are at risk for this type of infection. ? Your sexual activity has changed since you were last screened and you are at an increased risk for chlamydia or gonorrhea. Ask your health care provider if you are at risk.  If you do  not have HIV, but are at risk, it may be recommended that you take a prescription medicine daily to prevent HIV infection. This is called pre-exposure prophylaxis (PrEP). You are considered at risk if: ? You are sexually active and do not regularly use condoms or know the HIV status of your partner(s). ? You take drugs by injection. ? You are sexually active with a partner who has HIV.  Talk with your health care provider about whether you are at high risk of being infected with HIV. If you choose to begin PrEP, you should first be tested for HIV. You should then be tested every 3 months for as long as you are taking PrEP. Pregnancy  If you are premenopausal and you may become pregnant, ask your health care provider about preconception counseling.  If you may become pregnant, take 400 to 800 micrograms (mcg) of folic acid every day.  If you want to prevent pregnancy, talk to your health care provider about birth control (contraception). Osteoporosis and menopause  Osteoporosis is a disease in which the bones lose minerals and strength with aging. This can result in serious bone fractures. Your risk for osteoporosis can be identified using a bone density scan.  If you are 5 years of age or older, or if you are at risk for osteoporosis and fractures, ask your health care provider if you should be screened.  Ask your health care provider whether you should take a calcium or vitamin D supplement to lower your risk for osteoporosis.  Menopause may have certain physical symptoms and  risks.  Hormone replacement therapy may reduce some of these symptoms and risks. Talk to your health care provider about whether hormone replacement therapy is right for you. Follow these instructions at home:  Schedule regular health, dental, and eye exams.  Stay current with your immunizations.  Do not use any tobacco products including cigarettes, chewing tobacco, or electronic cigarettes.  If you are pregnant, do not drink alcohol.  If you are breastfeeding, limit how much and how often you drink alcohol.  Limit alcohol intake to no more than 1 drink per day for nonpregnant women. One drink equals 12 ounces of beer, 5 ounces of wine, or 1 ounces of hard liquor.  Do not use street drugs.  Do not share needles.  Ask your health care provider for help if you need support or information about quitting drugs.  Tell your health care provider if you often feel depressed.  Tell your health care provider if you have ever been abused or do not feel safe at home. This information is not intended to replace advice given to you by your health care provider. Make sure you discuss any questions you have with your health care provider. Document Released: 10/17/2010 Document Revised: 09/09/2015 Document Reviewed: 01/05/2015 Elsevier Interactive Patient Education  [employer].

## 2018-03-04 NOTE — Assessment & Plan Note (Addendum)
No headache today.  Discussed with patient her prior history of strokelike headache, advised against a triptan at this time.  Furthermore, her headaches occur monthly, and resolve with Excedrin.  They have not changed in frequency, severity or presentation.  Advised her of any of the latter were to change, she would let me know immediately.  Patient verbalized understanding.  Will follow

## 2018-03-04 NOTE — Assessment & Plan Note (Signed)
Clinical breast exam performed today.  Advised patient that she should pursue a baseline mammogram.  I ordered this today and she will call her insurance company as well as schedule this at [place].  Referral placed dermatology for annual skin check.  Deferred pelvic exam in the absence of complaints and as patient follows with GYN and plans to make an appointment for her Pap smear.

## 2018-03-05 LAB — CELIAC DISEASE AB SCREEN W/RFX
Antigliadin Abs, IgA: 8 units (ref 0–19)
IGA/IMMUNOGLOBULIN A, SERUM: 341 mg/dL (ref 87–352)
Transglutaminase IgA: <2 U/mL (ref 0–3)

## 2018-03-18 DIAGNOSIS — R1084 Generalized abdominal pain: Secondary | ICD-10-CM | POA: Diagnosis not present

## 2018-03-18 DIAGNOSIS — R110 Nausea: Secondary | ICD-10-CM | POA: Diagnosis not present

## 2018-03-18 DIAGNOSIS — R197 Diarrhea, unspecified: Secondary | ICD-10-CM | POA: Diagnosis not present

## 2018-03-19 ENCOUNTER — Other Ambulatory Visit: Payer: Self-pay | Admitting: Gastroenterology

## 2018-03-19 DIAGNOSIS — R110 Nausea: Secondary | ICD-10-CM

## 2018-03-19 DIAGNOSIS — R197 Diarrhea, unspecified: Secondary | ICD-10-CM

## 2018-03-25 ENCOUNTER — Ambulatory Visit: Payer: BLUE CROSS/BLUE SHIELD | Attending: Gastroenterology

## 2018-04-22 ENCOUNTER — Encounter: Payer: Self-pay | Admitting: Family

## 2018-05-09 DIAGNOSIS — D229 Melanocytic nevi, unspecified: Secondary | ICD-10-CM | POA: Diagnosis not present

## 2018-05-09 DIAGNOSIS — L814 Other melanin hyperpigmentation: Secondary | ICD-10-CM | POA: Diagnosis not present

## 2018-05-09 DIAGNOSIS — B359 Dermatophytosis, unspecified: Secondary | ICD-10-CM | POA: Diagnosis not present

## 2018-05-09 DIAGNOSIS — Z1283 Encounter for screening for malignant neoplasm of skin: Secondary | ICD-10-CM | POA: Diagnosis not present

## 2018-05-13 DIAGNOSIS — K640 First degree hemorrhoids: Secondary | ICD-10-CM | POA: Diagnosis not present

## 2018-05-13 DIAGNOSIS — R1084 Generalized abdominal pain: Secondary | ICD-10-CM | POA: Diagnosis not present

## 2018-05-13 DIAGNOSIS — K591 Functional diarrhea: Secondary | ICD-10-CM | POA: Diagnosis not present

## 2018-05-13 DIAGNOSIS — R197 Diarrhea, unspecified: Secondary | ICD-10-CM | POA: Diagnosis not present

## 2018-07-29 ENCOUNTER — Telehealth: Payer: Self-pay

## 2018-07-29 NOTE — Telephone Encounter (Signed)
Copied from CRM [id number]. Topic: Appointment Scheduling - Scheduling Inquiry for Clinic >> Jul 26, 2018 11:41 AM Baldo Daub L wrote: Reason for CRM:   Pt calling because she would like an appointment to discuss headaches she is having. Pt can be reached at [phone]

## 2018-07-31 ENCOUNTER — Encounter: Payer: Self-pay | Admitting: Family

## 2018-07-31 ENCOUNTER — Ambulatory Visit (INDEPENDENT_AMBULATORY_CARE_PROVIDER_SITE_OTHER): Payer: BLUE CROSS/BLUE SHIELD | Admitting: Family

## 2018-07-31 DIAGNOSIS — R519 Headache, unspecified: Secondary | ICD-10-CM

## 2018-07-31 DIAGNOSIS — R51 Headache: Secondary | ICD-10-CM

## 2018-07-31 NOTE — Progress Notes (Signed)
This visit type was conducted due to national recommendations for restrictions regarding the COVID-19 pandemic (e.g. social distancing).  This format is felt to be most appropriate for this patient at this time.  All issues noted in this document were discussed and addressed.  No physical exam was performed (except for noted visual exam findings with Video Visits). Virtual Visit via Video Note  I connected with@  on 08/02/18 at  2:30 PM EDT by a video enabled telemedicine application and verified that I am speaking with the correct person using two identifiers.  Location patient: home Location provider:work Persons participating in the virtual visit: patient, provider  I discussed the limitations of evaluation and management by telemedicine and the availability of in person appointments. The patient expressed understanding and agreed to proceed. Midway through call, call was unfortunately dropped.    HPI: CC: HA which has completed resolved.  HA- 'always had monthly migraines which havent been a big deal.' Usually prior to period.  6 days ago had a HA after menses ( prior to before which is more typical)  and took excredin and nap. After nap, HA was severe and scared her. No h/o aura. No vision loss, numbness. Pain presented right sided on top of head. Then lingered for 2 days. No HA now.    No allergies or congestion. Sleeping well.   H/o visional loss,  in 2013 when pregnant. No HA at that time. Hospitliazed at [facility] , transferred to [facility] approx 01/21/2012 for HA and concern potential jugular vein thrombosis.   ROS: See pertinent positives and negatives per HPI.  Past Medical History:  Diagnosis Date  . Chicken pox   . Gestational diabetes    G1  . Migraines    hemiplegic migraines with G2    Past Surgical History:  Procedure Laterality Date  . BUNIONECTOMY  2009   bilateral    Family History  Problem Relation Age of Onset  . Diabetes Maternal Grandmother   . Heart disease  Paternal Grandfather   . Heart disease Father   . Hypertension Father   . Diabetes Paternal Grandmother   . Cancer Paternal Grandmother        kidney  . Pancreatic cancer Maternal Aunt   . Colon cancer Neg Hx     SOCIAL HX: never smoker   Current Outpatient Medications:  Marland Kitchen  Multiple Vitamins-Minerals (MULTIVITAMIN/EXTRA VITAMIN D3 PO), Take by mouth daily., Disp: , Rfl:   ASSESSMENT AND PLAN:  Discussed the following assessment and plan:  Nonintractable headache, unspecified chronicity pattern, unspecified headache type - Plan: MR Brain W Wo Contrast, Ambulatory referral to Neurology, MR MRA HEAD WO CONTRAST, CANCELED: MR MRA HEAD WO CONTRAST  Problem List Items Addressed This Visit      Other   Nonintractable headache - Primary    Pleased HA resolved at this time.  I do have her concern as patient has a complex history, hospitalized years ago for concern for jugular thrombosis.  We discussed the severity of her headache, acute onset and both of her comfort with pursuing MRI brain, MRA.  We will also consult with Leandro Reasoner whom she is seen in the past.  Patient will certainly stay vigilant and let me know of any new or worsening symptoms      Relevant Orders   MR Brain W Wo Contrast (Completed)   Ambulatory referral to Neurology   MR MRA HEAD WO CONTRAST (Completed)        I discussed the assessment  and treatment plan with the patient. The patient was provided an opportunity to ask questions and all were answered. The patient agreed with the plan and demonstrated an understanding of the instructions.   The patient was advised to call back or seek an in-person evaluation if the symptoms worsen or if the condition fails to improve as anticipated.   Mable Paris, FNP

## 2018-07-31 NOTE — Patient Instructions (Signed)
Please stay very vigilant in regards to headache, certainly if headache presents again in severity that was last week, please go to the emergency room.  Today we discussed referrals, orders. MRI/MRA; referral to neurology   I have placed these orders in the system for you.  Please be sure to give [place] a call if you have not heard from our office regarding this. We should hear from [place] within ONE week with information regarding your appointment. If not, please let me know immediately.

## 2018-08-01 ENCOUNTER — Ambulatory Visit
Admission: RE | Admit: 2018-08-01 | Discharge: 2018-08-01 | Disposition: A | Discharge status: Home / Self Care | Payer: BLUE CROSS/BLUE SHIELD | Source: Ambulatory Visit | Attending: Family | Admitting: Family

## 2018-08-01 ENCOUNTER — Encounter: Payer: Self-pay | Admitting: Family

## 2018-08-01 ENCOUNTER — Other Ambulatory Visit: Payer: Self-pay

## 2018-08-01 DIAGNOSIS — R51 Headache: Secondary | ICD-10-CM | POA: Insufficient documentation

## 2018-08-01 DIAGNOSIS — R519 Headache, unspecified: Secondary | ICD-10-CM

## 2018-08-01 MED ORDER — GADOBUTROL 1 MMOL/ML IV SOLN
6.0000 mL | Freq: Once | INTRAVENOUS | Status: AC | PRN
  Administered 2018-08-01: 6 mL via INTRAVENOUS

## 2018-08-02 ENCOUNTER — Telehealth: Payer: Self-pay | Admitting: Family

## 2018-08-02 ENCOUNTER — Encounter: Payer: Self-pay | Admitting: Family

## 2018-08-02 NOTE — Telephone Encounter (Signed)
The authorization is in her chart under authorization. One for MRI and MRA

## 2018-08-02 NOTE — Telephone Encounter (Signed)
Copied from CRM [id number]. Topic: General - Other >> Aug 02, 2018  1:49 PM Tamela Oddi wrote: Reason for CRM: [facility] called regarding a PA for an MRI which she stated the patient has had.  Please advise and call back for more information.  CB# [phone], ext. [phone]

## 2018-08-02 NOTE — Assessment & Plan Note (Signed)
Pleased HA resolved at this time.  I do have her concern as patient has a complex history, hospitalized years ago for concern for jugular thrombosis.  We discussed the severity of her headache, acute onset and both of her comfort with pursuing MRI brain, MRA.  We will also consult with Leandro Reasoner whom she is seen in the past.  Patient will certainly stay vigilant and let me know of any new or worsening symptoms

## 2018-08-05 ENCOUNTER — Telehealth: Payer: Self-pay

## 2018-08-05 NOTE — Telephone Encounter (Signed)
Sent skype message to [place] with information

## 2018-10-17 DIAGNOSIS — D2230 Melanocytic nevi of unspecified part of face: Secondary | ICD-10-CM | POA: Diagnosis not present

## 2018-10-17 DIAGNOSIS — D2270 Melanocytic nevi of unspecified lower limb, including hip: Secondary | ICD-10-CM | POA: Diagnosis not present

## 2018-10-17 DIAGNOSIS — D2260 Melanocytic nevi of unspecified upper limb, including shoulder: Secondary | ICD-10-CM | POA: Diagnosis not present

## 2018-10-17 DIAGNOSIS — D225 Melanocytic nevi of trunk: Secondary | ICD-10-CM | POA: Diagnosis not present

## 2019-06-23 DIAGNOSIS — M76891 Other specified enthesopathies of right lower limb, excluding foot: Secondary | ICD-10-CM | POA: Diagnosis not present

## 2019-06-30 DIAGNOSIS — M629 Disorder of muscle, unspecified: Secondary | ICD-10-CM | POA: Diagnosis not present

## 2019-09-17 DIAGNOSIS — Z681 Body mass index (BMI) 19 or less, adult: Secondary | ICD-10-CM | POA: Diagnosis not present

## 2019-09-17 DIAGNOSIS — Z136 Encounter for screening for cardiovascular disorders: Secondary | ICD-10-CM | POA: Diagnosis not present

## 2019-09-17 DIAGNOSIS — H6120 Impacted cerumen, unspecified ear: Secondary | ICD-10-CM | POA: Diagnosis not present

## 2020-03-01 ENCOUNTER — Other Ambulatory Visit: Payer: Self-pay

## 2020-03-03 ENCOUNTER — Encounter: Payer: Self-pay | Admitting: Family

## 2020-03-03 ENCOUNTER — Telehealth: Payer: Self-pay | Admitting: Family

## 2020-03-03 ENCOUNTER — Other Ambulatory Visit: Payer: Self-pay

## 2020-03-03 ENCOUNTER — Ambulatory Visit (INDEPENDENT_AMBULATORY_CARE_PROVIDER_SITE_OTHER): Payer: BC Managed Care – PPO | Admitting: Family

## 2020-03-03 VITALS — BP 112/80 | HR 97 | Temp 98.0°F | Ht 67.0 in | Wt 134.0 lb

## 2020-03-03 DIAGNOSIS — R519 Headache, unspecified: Secondary | ICD-10-CM | POA: Diagnosis not present

## 2020-03-03 DIAGNOSIS — Z0000 Encounter for general adult medical examination without abnormal findings: Secondary | ICD-10-CM

## 2020-03-03 DIAGNOSIS — R61 Generalized hyperhidrosis: Secondary | ICD-10-CM | POA: Diagnosis not present

## 2020-03-03 DIAGNOSIS — R17 Unspecified jaundice: Secondary | ICD-10-CM

## 2020-03-03 LAB — COMPREHENSIVE METABOLIC PANEL
ALT: 17 U/L (ref 0–35)
AST: 20 U/L (ref 0–37)
Albumin: 4.6 g/dL (ref 3.5–5.2)
Alkaline Phosphatase: 50 U/L (ref 39–117)
BUN: 11 mg/dL (ref 6–23)
CO2: 27 mEq/L (ref 19–32)
Calcium: 9.2 mg/dL (ref 8.4–10.5)
Chloride: 102 mEq/L (ref 96–112)
Creatinine, Ser: 0.76 mg/dL (ref 0.40–1.20)
GFR: 98.31 mL/min (ref >60.00)
Glucose, Bld: 75 mg/dL (ref 70–99)
Potassium: 3.4 mEq/L — ABNORMAL LOW (ref 3.5–5.1)
Sodium: 138 mEq/L (ref 135–145)
Total Bilirubin: 1.8 mg/dL — ABNORMAL HIGH (ref 0.2–1.2)
Total Protein: 7.2 g/dL (ref 6.0–8.3)

## 2020-03-03 LAB — LIPID PANEL
Cholesterol: 180 mg/dL (ref 0–200)
HDL: 85.3 mg/dL (ref >39.00)
LDL Cholesterol: 88 mg/dL (ref 0–99)
NonHDL: 94.76
Total CHOL/HDL Ratio: 2
Triglycerides: 35 mg/dL (ref 0.0–149.0)
VLDL: 7 mg/dL (ref 0.0–40.0)

## 2020-03-03 LAB — CBC WITH DIFFERENTIAL/PLATELET
Basophils Absolute: 0 10*3/uL (ref 0.0–0.1)
Basophils Relative: 0.5 % (ref 0.0–3.0)
Eosinophils Absolute: 0 10*3/uL (ref 0.0–0.7)
Eosinophils Relative: 0.4 % (ref 0.0–5.0)
HCT: 39.7 % (ref 36.0–46.0)
Hemoglobin: 13.5 g/dL (ref 12.0–15.0)
Lymphocytes Relative: 24.5 % (ref 12.0–46.0)
Lymphs Abs: 1.3 10*3/uL (ref 0.7–4.0)
MCHC: 34 g/dL (ref 30.0–36.0)
MCV: 92.2 fl (ref 78.0–100.0)
Monocytes Absolute: 0.4 10*3/uL (ref 0.1–1.0)
Monocytes Relative: 8.5 % (ref 3.0–12.0)
Neutro Abs: 3.4 10*3/uL (ref 1.4–7.7)
Neutrophils Relative %: 66.1 % (ref 43.0–77.0)
Platelets: 208 10*3/uL (ref 150.0–400.0)
RBC: 4.31 Mil/uL (ref 3.87–5.11)
RDW: 12.5 % (ref 11.5–15.5)
WBC: 5.1 10*3/uL (ref 4.0–10.5)

## 2020-03-03 LAB — TSH: TSH: 1.06 u[IU]/mL (ref 0.35–4.50)

## 2020-03-03 LAB — FOLLICLE STIMULATING HORMONE: FSH: 8.3 m[IU]/mL

## 2020-03-03 LAB — VITAMIN D 25 HYDROXY (VIT D DEFICIENCY, FRACTURES): VITD: 37.35 ng/mL (ref 30.00–100.00)

## 2020-03-03 LAB — HEMOGLOBIN A1C: Hgb A1c MFr Bld: 5.1 % (ref 4.6–6.5)

## 2020-03-03 NOTE — Patient Instructions (Signed)
Nice to see you.  We will do thorough lab evaluation and discuss further evaluation of night sweats once we have all results  Please schedule baseline mammogram  Please call  and schedule your 3D mammogram as discussed.   [facility]  [address]  [place], [place]  [phone]  Health Maintenance, Female Adopting a healthy lifestyle and getting preventive care are important in promoting health and wellness. Ask your health care provider about:  The right schedule for you to have regular tests and exams.  Things you can do on your own to prevent diseases and keep yourself healthy. What should I know about diet, weight, and exercise? Eat a healthy diet   Eat a diet that includes plenty of vegetables, fruits, low-fat dairy products, and lean protein.  Do not eat a lot of foods that are high in solid fats, added sugars, or sodium. Maintain a healthy weight Body mass index (BMI) is used to identify weight problems. It estimates body fat based on height and weight. Your health care provider can help determine your BMI and help you achieve or maintain a healthy weight. Get regular exercise Get regular exercise. This is one of the most important things you can do for your health. Most adults should:  Exercise for at least 150 minutes each week. The exercise should increase your heart rate and make you sweat (moderate-intensity exercise).  Do strengthening exercises at least twice a week. This is in addition to the moderate-intensity exercise.  Spend less time sitting. Even light physical activity can be beneficial. Watch cholesterol and blood lipids Have your blood tested for lipids and cholesterol at 40 years of age, then have this test every 5 years. Have your cholesterol levels checked more often if:  Your lipid or cholesterol levels are high.  You are older than 40 years of age.  You are at high risk for heart disease. What should I know about  cancer screening? Depending on your health history and family history, you may need to have cancer screening at various ages. This may include screening for:  Breast cancer.  Cervical cancer.  Colorectal cancer.  Skin cancer.  Lung cancer. What should I know about heart disease, diabetes, and high blood pressure? Blood pressure and heart disease  High blood pressure causes heart disease and increases the risk of stroke. This is more likely to develop in people who have high blood pressure readings, are of African descent, or are overweight.  Have your blood pressure checked: ? Every 3-5 years if you are 38-64 years of age. ? Every year if you are 2 years old or older. Diabetes Have regular diabetes screenings. This checks your fasting blood sugar level. Have the screening done:  Once every three years after age 44 if you are at a normal weight and have a low risk for diabetes.  More often and at a younger age if you are overweight or have a high risk for diabetes. What should I know about preventing infection? Hepatitis B If you have a higher risk for hepatitis B, you should be screened for this virus. Talk with your health care provider to find out if you are at risk for hepatitis B infection. Hepatitis C Testing is recommended for:  Everyone born from [birth year] through 1966.  Anyone with known risk factors for hepatitis C. Sexually transmitted infections (STIs)  Get screened for STIs, including gonorrhea and chlamydia, if: ? You are sexually active and are younger than 40  years of age. ? You are older than 40 years of age and your health care provider tells you that you are at risk for this type of infection. ? Your sexual activity has changed since you were last screened, and you are at increased risk for chlamydia or gonorrhea. Ask your health care provider if you are at risk.  Ask your health care provider about whether you are at high risk for HIV. Your health care  provider may recommend a prescription medicine to help prevent HIV infection. If you choose to take medicine to prevent HIV, you should first get tested for HIV. You should then be tested every 3 months for as long as you are taking the medicine. Pregnancy  If you are about to stop having your period (premenopausal) and you may become pregnant, seek counseling before you get pregnant.  Take 400 to 800 micrograms (mcg) of folic acid every day if you become pregnant.  Ask for birth control (contraception) if you want to prevent pregnancy. Osteoporosis and menopause Osteoporosis is a disease in which the bones lose minerals and strength with aging. This can result in bone fractures. If you are 22 years old or older, or if you are at risk for osteoporosis and fractures, ask your health care provider if you should:  Be screened for bone loss.  Take a calcium or vitamin D supplement to lower your risk of fractures.  Be given hormone replacement therapy (HRT) to treat symptoms of menopause. Follow these instructions at home: Lifestyle  Do not use any products that contain nicotine or tobacco, such as cigarettes, e-cigarettes, and chewing tobacco. If you need help quitting, ask your health care provider.  Do not use street drugs.  Do not share needles.  Ask your health care provider for help if you need support or information about quitting drugs. Alcohol use  Do not drink alcohol if: ? Your health care provider tells you not to drink. ? You are pregnant, may be pregnant, or are planning to become pregnant.  If you drink alcohol: ? Limit how much you use to 0-1 drink a day. ? Limit intake if you are breastfeeding.  Be aware of how much alcohol is in your drink. In the [place] one drink equals one 12 oz bottle of beer (355 mL), one 5 oz glass of wine (148 mL), or one 1 oz glass of hard liquor (44 mL). General instructions  Schedule regular health, dental, and eye exams.  Stay current  with your vaccines.  Tell your health care provider if: ? You often feel depressed. ? You have ever been abused or do not feel safe at home. Summary  Adopting a healthy lifestyle and getting preventive care are important in promoting health and wellness.  Follow your health care provider's instructions about healthy diet, exercising, and getting tested or screened for diseases.  Follow your health care provider's instructions on monitoring your cholesterol and blood pressure. This information is not intended to replace advice given to you by your health care provider. Make sure you discuss any questions you have with your health care provider. Document Revised: 03/27/2018 Document Reviewed: 03/27/2018 Elsevier Patient Education  2020 [employer].

## 2020-03-03 NOTE — Telephone Encounter (Signed)
Add on sheet faxed 

## 2020-03-03 NOTE — Telephone Encounter (Signed)
Can I add hepatic function panel?

## 2020-03-03 NOTE — Progress Notes (Signed)
Subjective:    Patient ID: Sara James, female    DOB: 1980-06-24, 39 y.o.   MRN: [id number]  CC: Sara James is a 39 y.o. female who presents today for physical exam.    HPI: Headache are infrequent. Approx once per month.  unchneged from intensity or frequency Excredin works well. Bilateral frontal HA which is typical presentation for her.  No vision changes, aura.   Complains of night sweats over chest, for 1 year at least perhaps more, this occurs year round. Occurs every night.  Soaks through pajamas.   She has also kept temperature low in house.   No fever, fatigue, weight loss, bone pain.  She doesn't have excessive facial hair. No h/o infertility.   Regular monthly cycles.     Colorectal Cancer Screening: no early family history. Colonoscopy done by Dr Norma Fredrickson ( unable to report).   Breast Cancer Screening: no family h/o breast cancer.  Cervical Cancer Screening: due , follows with GYN         Tetanus - UTD         Hepatitis C screening - Candidate for, consents HIV Screening- Candidate for , consents Labs: Screening labs today. Exercise: Gets regular exercise, hot yoga.  Alcohol use:  rarely Smoking/tobacco use: Nonsmoker.     HISTORY:  Past Medical History:  Diagnosis Date  . Chicken pox   . Gestational diabetes    G1  . Migraines    hemiplegic migraines with G2    Past Surgical History:  Procedure Laterality Date  . BUNIONECTOMY  2009   bilateral   Family History  Problem Relation Age of Onset  . Diabetes Maternal Grandmother   . Heart disease Paternal Grandfather   . Heart disease Father   . Hypertension Father   . Diabetes Paternal Grandmother   . Cancer Paternal Grandmother        kidney  . Pancreatic cancer Maternal Aunt   . Colon cancer Neg Hx   . Breast cancer Neg Hx       ALLERGIES: Patient has no known allergies.  Current Outpatient Medications on File Prior to Visit  Medication Sig Dispense Refill  . Multiple  Vitamins-Minerals (MULTIVITAMIN/EXTRA VITAMIN D3 PO) Take by mouth daily.     No current facility-administered medications on file prior to visit.    Social History   Tobacco Use  . Smoking status: Never Smoker  . Smokeless tobacco: Never Used  Substance Use Topics  . Alcohol use: Yes    Alcohol/week: 0.0 standard drinks    Comment: 1 rarely   . Drug use: No    Review of Systems  Constitutional: Positive for diaphoresis. Negative for chills, fatigue, fever and unexpected weight change.  Respiratory: Negative for cough.   Cardiovascular: Negative for chest pain and palpitations.  Gastrointestinal: Negative for nausea and vomiting.  Neurological: Negative for headaches (infrequent).      Objective:    BP 112/80 (BP Location: Left Arm, Patient Position: Sitting, Cuff Size: Normal)   Pulse 97   Temp 98 F (36.7 C) (Oral)   Ht 5\' 7"  (1.702 m)   Wt 134 lb (60.8 kg)   SpO2 99%   BMI 20.99 kg/m   BP Readings from Last 3 Encounters:  03/03/20 112/80  03/04/18 110/70  03/09/17 98/60   Wt Readings from Last 3 Encounters:  03/03/20 134 lb (60.8 kg)  03/04/18 129 lb (58.5 kg)  03/09/17 130 lb 12 oz (59.3 kg)    Physical Exam Vitals  reviewed.  Constitutional:      Appearance: She is well-developed.  Eyes:     Conjunctiva/sclera: Conjunctivae normal.  Neck:     Thyroid: No thyroid mass or thyromegaly.  Cardiovascular:     Rate and Rhythm: Normal rate and regular rhythm.     Pulses: Normal pulses.     Heart sounds: Normal heart sounds.  Pulmonary:     Effort: Pulmonary effort is normal.     Breath sounds: Normal breath sounds. No wheezing, rhonchi or rales.  Chest:     Breasts: Breasts are symmetrical.        Right: No inverted nipple, mass, nipple discharge, skin change or tenderness.        Left: No inverted nipple, mass, nipple discharge, skin change or tenderness.  Lymphadenopathy:     Head:     Right side of head: No submental, submandibular, tonsillar,  preauricular, posterior auricular or occipital adenopathy.     Left side of head: No submental, submandibular, tonsillar, preauricular, posterior auricular or occipital adenopathy.     Cervical: No cervical adenopathy.     Right cervical: No superficial, deep or posterior cervical adenopathy.    Left cervical: No superficial, deep or posterior cervical adenopathy.  Skin:    General: Skin is warm and dry.  Neurological:     Mental Status: She is alert.  Psychiatric:        Speech: Speech normal.        Behavior: Behavior normal.        Thought Content: Thought content normal.        Assessment & Plan:   Problem List Items Addressed This Visit      Other   Diaphoresis    Question acute on chronic, unchanged. No other B symptoms. Low suspicion of early menopause as cycles regular and this has been occurring for years. No evidence of androgen excess.  Normal TSH, PRL, FSH. I have sent a staff message to Dr Donneta Romberg in regards to heme/onc work up and after discussion with patient and Dr Sharlette Dense advice, patient and I agreed to hold on imaging including CT C/A/P and pursue second opinion with endocrine. Will follow.       Headache    Stable. Infrequent. She will let me know of any concerns. Will follow.       Routine physical examination - Primary    CBE performed today and mammogram ordered. Patient will schedule. Pap Due however patient declines having done today as she would like to see GYN for this. Encouraged continued exercise.       Relevant Orders   TSH (Completed)   CBC with Differential/Platelet (Completed)   Comprehensive metabolic panel (Completed)   Hemoglobin A1c (Completed)   Hepatitis C antibody (Completed)   HIV Antibody (routine testing w rflx) (Completed)   Lipid panel (Completed)   VITAMIN D 25 Hydroxy (Vit-D Deficiency, Fractures) (Completed)   Prolactin (Completed)   FSH (Completed)   MM 3D SCREEN BREAST BILATERAL       I am having Sara James maintain her Multiple Vitamins-Minerals (MULTIVITAMIN/EXTRA VITAMIN D3 PO).   No orders of the defined types were placed in this encounter.   Return precautions given.   Risks, benefits, and alternatives of the medications and treatment plan prescribed today were discussed, and patient expressed understanding.   Education regarding symptom management and diagnosis given to patient on AVS.   Continue to follow with Allegra Grana, FNP for routine health maintenance.  Sara James and I agreed with plan.   Rennie Plowman, FNP

## 2020-03-04 ENCOUNTER — Other Ambulatory Visit (INDEPENDENT_AMBULATORY_CARE_PROVIDER_SITE_OTHER): Payer: BC Managed Care – PPO

## 2020-03-04 DIAGNOSIS — R17 Unspecified jaundice: Secondary | ICD-10-CM | POA: Diagnosis not present

## 2020-03-04 DIAGNOSIS — R61 Generalized hyperhidrosis: Secondary | ICD-10-CM | POA: Insufficient documentation

## 2020-03-04 LAB — HEPATITIS C ANTIBODY
Hepatitis C Ab: NONREACTIVE
SIGNAL TO CUT-OFF: 0.02 (ref <1.00)

## 2020-03-04 LAB — HIV ANTIBODY (ROUTINE TESTING W REFLEX): HIV 1&2 Ab, 4th Generation: NONREACTIVE

## 2020-03-04 LAB — HEPATIC FUNCTION PANEL
ALT: 17 U/L (ref 0–35)
AST: 20 U/L (ref 0–37)
Albumin: 4.5 g/dL (ref 3.5–5.2)
Alkaline Phosphatase: 50 U/L (ref 39–117)
Bilirubin, Direct: 0.2 mg/dL (ref 0.0–0.3)
Total Bilirubin: 1.3 mg/dL — ABNORMAL HIGH (ref 0.2–1.2)
Total Protein: 7.2 g/dL (ref 6.0–8.3)

## 2020-03-04 LAB — PROLACTIN: Prolactin: 6.2 ng/mL

## 2020-03-04 NOTE — Assessment & Plan Note (Signed)
CBE performed today and mammogram ordered. Patient will schedule. Pap Due however patient declines having done today as she would like to see GYN for this. Encouraged continued exercise.

## 2020-03-04 NOTE — Assessment & Plan Note (Addendum)
Stable. Infrequent. She will let me know of any concerns. Will follow.

## 2020-03-04 NOTE — Assessment & Plan Note (Addendum)
Question acute on chronic, unchanged. No other B symptoms. Low suspicion of early menopause as cycles regular and this has been occurring for years. No evidence of androgen excess.  Normal TSH, PRL, FSH. I have sent a staff message to Dr Donneta Romberg in regards to heme/onc work up and after discussion with patient and Dr Sharlette Dense advice, patient and I agreed to hold on imaging including CT C/A/P and pursue second opinion with endocrine. Will follow.

## 2020-03-05 ENCOUNTER — Telehealth: Payer: Self-pay | Admitting: Family

## 2020-03-05 ENCOUNTER — Other Ambulatory Visit: Payer: Self-pay | Admitting: Family

## 2020-03-05 DIAGNOSIS — R61 Generalized hyperhidrosis: Secondary | ICD-10-CM

## 2020-03-05 NOTE — Telephone Encounter (Signed)
-----   Message from Earna Coder, MD sent at 03/04/2020  7:30 AM EST ----- [facility] you are doing well too. I dont think she needs to see [place] at this point.   Looks like pt has vaso-motor symptoms- ? Endocrine or neurologic causes. These symptoms can occur in lymphomas but as accompanied by other clinical findings- weight loss/labs abnormalities/ or imaging findings. If pt/you are very concerned CT C/A/P could be done- but my guess the yield would be low.   My suggestion would be is to watch closely for now. Obviously order imaging if malignancy is a concern.  Take care, GB  ----- Message ----- From: Allegra Grana, FNP Sent: 03/04/2020   6:15 AM EST To: Earna Coder, MD  Dr Donneta Romberg,   [facility] you are well.   This non obese 39 F at her annual physical yesterday complained of 'night sweats' over chest which have been occurring for several years as well as year round. Occurs every night and she 'soaks' through pajamas.  No fever, fatigue, weight loss, bone pain.  She doesn't have excessive facial hair. No h/o infertility.   Regular monthly cycles.   Lab evaluation unrevealing. Mammogram ( her first) is ordered. Pap due however this is done with GYN.   Do you feel this warrants a referral to you? Or perhaps this is just her set point and she is hot natured?   Sorry to bother you!   Claris Che

## 2020-03-05 NOTE — Telephone Encounter (Signed)
Called patient to discuss Dr Sharlette Dense note in particularly symptoms of malignancy which we agreed she did not have overt symptoms of this.  We discussed again that night sweats only occur at night.  Night sweats have been going on at least a year but not sure how long. She feels after last child 5 years ago that perhaps this started  We jointly agreed referral to endocrine for second opinion and to hold on any imaging including CT C/A/P.  Labs discussed as well. Slight low potassium. We will repeat.

## 2020-03-18 ENCOUNTER — Other Ambulatory Visit (INDEPENDENT_AMBULATORY_CARE_PROVIDER_SITE_OTHER): Payer: BC Managed Care – PPO

## 2020-03-18 ENCOUNTER — Other Ambulatory Visit: Payer: Self-pay

## 2020-03-18 DIAGNOSIS — R61 Generalized hyperhidrosis: Secondary | ICD-10-CM | POA: Diagnosis not present

## 2020-03-18 LAB — BASIC METABOLIC PANEL
BUN: 13 mg/dL (ref 6–23)
CO2: 26 mEq/L (ref 19–32)
Calcium: 9.4 mg/dL (ref 8.4–10.5)
Chloride: 103 mEq/L (ref 96–112)
Creatinine, Ser: 0.78 mg/dL (ref 0.40–1.20)
GFR: 95.26 mL/min (ref >60.00)
Glucose, Bld: 89 mg/dL (ref 70–99)
Potassium: 4.2 mEq/L (ref 3.5–5.1)
Sodium: 137 mEq/L (ref 135–145)

## 2020-03-25 ENCOUNTER — Ambulatory Visit
Admission: RE | Admit: 2020-03-25 | Discharge: 2020-03-25 | Disposition: A | Discharge status: Home / Self Care | Payer: BC Managed Care – PPO | Source: Ambulatory Visit | Attending: Family | Admitting: Family

## 2020-03-25 ENCOUNTER — Other Ambulatory Visit: Payer: Self-pay

## 2020-03-25 DIAGNOSIS — Z1231 Encounter for screening mammogram for malignant neoplasm of breast: Secondary | ICD-10-CM | POA: Diagnosis not present

## 2020-03-25 DIAGNOSIS — Z0000 Encounter for general adult medical examination without abnormal findings: Secondary | ICD-10-CM

## 2020-04-20 ENCOUNTER — Encounter: Payer: Self-pay | Admitting: Endocrinology

## 2020-04-20 ENCOUNTER — Ambulatory Visit: Payer: BC Managed Care – PPO | Admitting: Endocrinology

## 2020-04-20 ENCOUNTER — Other Ambulatory Visit: Payer: Self-pay

## 2020-04-20 VITALS — BP 118/72 | HR 68 | Ht 67.5 in | Wt 132.0 lb

## 2020-04-20 DIAGNOSIS — R61 Generalized hyperhidrosis: Secondary | ICD-10-CM

## 2020-04-20 LAB — CORTISOL: Cortisol, Plasma: 5 ug/dL

## 2020-04-20 NOTE — Progress Notes (Signed)
Subjective:    Patient ID: Sara James, female    DOB: Feb 09, 1981, 40 y.o.   MRN: [id number]  HPI Pt is referred by Rennie Plowman, NP, for excessive diaphoresis.  she reports intermitt night sweats.  She is unable to cite precip factor, but she had similar sxs in the postpartum state, 5-10 years ago.  She also has intermitt headache and palpitations.  He has no h/o HTN.  she has no h/o adrenal disease, MTC, alcohol/drug use, neurofibromas, hemangiomas, brain aneurysm, or thyroid disease.  She has reg menses.  She has never had adrenal imaging.  Past Medical History:  Diagnosis Date  . Chicken pox   . Gestational diabetes    G1  . Migraines    hemiplegic migraines with G2    Past Surgical History:  Procedure Laterality Date  . BUNIONECTOMY  2009   bilateral    Social History   Socioeconomic History  . Marital status: Married    Spouse name: Not on file  . Number of children: Not on file  . Years of education: Not on file  . Highest education level: Not on file  Occupational History  . Not on file  Tobacco Use  . Smoking status: Never Smoker  . Smokeless tobacco: Never Used  Substance and Sexual Activity  . Alcohol use: Yes    Alcohol/week: 0.0 standard drinks    Comment: 1 rarely   . Drug use: No  . Sexual activity: Yes    Partners: Male    Comment: Husband  Other Topics Concern  . Not on file  Social History Narrative   Stay at home mother    Master's degree- highest level    Lives with husband and 3 children -   10,8 , and 4months.    Pets: 1 dog    Caffeine- coffee 1 cup   Social Determinants of Health   Financial Resource Strain: Not on file  Food Insecurity: Not on file  Transportation Needs: Not on file  Physical Activity: Not on file  Stress: Not on file  Social Connections: Not on file  Intimate Partner Violence: Not on file    Current Outpatient Medications on File Prior to Visit  Medication Sig Dispense Refill  . Multiple  Vitamins-Minerals (MULTIVITAMIN/EXTRA VITAMIN D3 PO) Take by mouth daily.     No current facility-administered medications on file prior to visit.    No Known Allergies  Family History  Problem Relation Age of Onset  . Diabetes Maternal Grandmother   . Heart disease Paternal Grandfather   . Heart disease Father   . Hypertension Father   . Diabetes Paternal Grandmother   . Cancer Paternal Grandmother        kidney  . Pancreatic cancer Maternal Aunt   . Colon cancer Neg Hx   . Breast cancer Neg Hx     BP 118/72   Pulse 68   Ht 5' 7.5" (1.715 m)   Wt 132 lb (59.9 kg)   LMP 03/27/2020   SpO2 98%   BMI 20.37 kg/m    Review of Systems denies flushing, pallor, n/v, syncope, weight loss, chest pain, sob, anxiety, and fever.      Objective:   Physical Exam VS: see vs page GEN: no distress HEAD: head: no deformity eyes: no periorbital swelling, no proptosis external nose and ears are normal NECK: supple, thyroid is not enlarged CHEST WALL: no deformity LUNGS: clear to auscultation CV: reg rate and rhythm, no murmur.  MUSCULOSKELETAL:  gait is normal and steady EXTEMITIES: no deformity.  no leg edema NEURO:  readily moves all 4's.  sensation is intact to touch on all 4's SKIN:  Normal texture and temperature.  No rash or suspicious lesion is visible.   NODES:  None palpable at the neck PSYCH: alert, well-oriented.  Does not appear anxious nor depressed.   MRI: no mention is made of the pituitary.  Lab Results  Component Value Date   TSH 1.06 03/03/2020    I have reviewed outside records, and summarized: Pt was noted to have above sxs, and referred here. Headache and wellness were also addressed.        Assessment & Plan:  Excessive diaphoresis, new to me, uncertain etiology and prognosis.   Patient Instructions  Blood tests, and a 24-HR urine test are requested for you today.  We'll let you know about the results.  We can recheck the 24-HR urine test if the  symptoms persist--just let me know.

## 2020-04-20 NOTE — Patient Instructions (Signed)
Blood tests, and a 24-HR urine test are requested for you today.  We'll let you know about the results.  We can recheck the 24-HR urine test if the symptoms persist--just let me know.

## 2020-04-29 LAB — METANEPHRINES, PLASMA
Metanephrine, Free: 59 pg/mL — ABNORMAL HIGH (ref <=57)
Normetanephrine, Free: 135 pg/mL (ref <=148)
Total Metanephrines-Plasma: 194 pg/mL (ref <=205)

## 2020-04-29 LAB — ALDOSTERONE + RENIN ACTIVITY W/ RATIO: Aldosterone: 1 ng/dL

## 2020-04-29 LAB — CATECHOLAMINES, FRACTIONATED, PLASMA
Dopamine: <10 pg/mL
Epinephrine: 22 pg/mL
Norepinephrine: 354 pg/mL
Total Catecholamines: 376 pg/mL

## 2020-04-29 LAB — CALCITONIN: Calcitonin: <2 pg/mL (ref <=5)

## 2020-05-19 ENCOUNTER — Ambulatory Visit: Payer: BC Managed Care – PPO | Admitting: Podiatry

## 2020-05-19 ENCOUNTER — Other Ambulatory Visit: Payer: Self-pay

## 2020-05-19 ENCOUNTER — Encounter: Payer: Self-pay | Admitting: Podiatry

## 2020-05-19 DIAGNOSIS — B351 Tinea unguium: Secondary | ICD-10-CM

## 2020-05-19 MED ORDER — TERBINAFINE HCL 250 MG PO TABS: 250.0000 mg | ORAL_TABLET | Freq: Every day | ORAL | 0 refills | Status: AC

## 2020-05-19 MED ORDER — CICLOPIROX 8 % EX SOLN: Freq: Every day | CUTANEOUS | 0 refills | Status: DC

## 2020-05-19 NOTE — Progress Notes (Signed)
  Subjective:  Patient ID: Sara James, female    DOB: [birth year],  MRN: [id number]  Chief Complaint  Patient presents with  . Nail Problem    Patient presents today for nail fungus right 4th and 5th toe x 1 year    40 y.o. female presents with the above complaint. History confirmed with patient.  Her dermatologist previously saw her for this and prescribed her topical terbinafine cream  Objective:  Physical Exam: warm, good capillary refill, no trophic changes or ulcerative lesions, normal DP and PT pulses and normal sensory exam.   Right Foot: Dystrophic thickened nail plates with brown and yellow discoloration  Assessment:   1. Onychomycosis      Plan:  Patient was evaluated and treated and all questions answered.  Discussed etiology and treatment options of onychomycosis in detail with the patient.  We discussed oral, topical and laser therapy.  Recommend we begin with oral and topical therapy as she has not had this before.  Rx for Lamisil and Penlac sent to pharmacy.  She has no history of liver disease and does not drink alcohol.  Advised to take photographs.  Return in about 4 months (around 09/16/2020).

## 2020-05-19 NOTE — Patient Instructions (Signed)
Fungal Nail Infection A fungal nail infection is a common infection of the toenails or fingernails. This condition affects toenails more often than fingernails. It often affects the great, or big, toes. More than one nail may be infected. The condition can be passed from person to person (is contagious). What are the causes? This condition is caused by a fungus. Several types of fungi can cause the infection. These fungi are common in moist and warm areas. If your hands or feet come into contact with the fungus, it may get into a crack in your fingernail or toenail and cause the infection. What increases the risk? The following factors may make you more likely to develop this condition:  Being female.  Being of older age.  Living with someone who has the fungus.  Walking barefoot in areas where the fungus thrives, such as showers or locker rooms.  Wearing shoes and socks that cause your feet to sweat.  Having a nail injury or a recent nail surgery.  Having certain medical conditions, such as: ? Athlete's foot. ? Diabetes. ? Psoriasis. ? Poor circulation. ? A weak body defense system (immune system). What are the signs or symptoms? Symptoms of this condition include:  A pale spot on the nail.  Thickening of the nail.  A nail that becomes yellow or brown.  A brittle or ragged nail edge.  A crumbling nail.  A nail that has lifted away from the nail bed.   How is this diagnosed? This condition is diagnosed with a physical exam. Your health care provider may take a scraping or clipping from your nail to test for the fungus. How is this treated? Treatment is not needed for mild infections. If you have significant nail changes, treatment may include:  Antifungal medicines taken by mouth (orally). You may need to take the medicine for several weeks or several months, and you may not see the results for a long time. These medicines can cause side effects. Ask your health care  provider what problems to watch for.  Antifungal nail polish or nail cream. These may be used along with oral antifungal medicines.  Laser treatment of the nail.  Surgery to remove the nail. This may be needed for the most severe infections. It can take a long time, usually up to a year, for the infection to go away. The infection may also come back.   Follow these instructions at home: Medicines  Take or apply over-the-counter and prescription medicines only as told by your health care provider.  Ask your health care provider about using over-the-counter mentholated ointment on your nails. Nail care  Trim your nails often.  Wash and dry your hands and feet every day.  Keep your feet dry: ? Wear absorbent socks, and change your socks frequently. ? Wear shoes that allow air to circulate, such as sandals or canvas tennis shoes. Throw out old shoes.  Do not use artificial nails.  If you go to a nail salon, make sure you choose one that uses clean instruments.  Use antifungal foot powder on your feet and in your shoes. General instructions  Do not share personal items, such as towels or nail clippers.  Do not walk barefoot in shower rooms or locker rooms.  Wear rubber gloves if you are working with your hands in wet areas.  Keep all follow-up visits as told by your health care provider. This is important. Contact a health care provider if: Your infection is not getting better or   it is getting worse after several months. Summary  A fungal nail infection is a common infection of the toenails or fingernails.  Treatment is not needed for mild infections. If you have significant nail changes, treatment may include taking medicine orally and applying medicine to your nails.  It can take a long time, usually up to a year, for the infection to go away. The infection may also come back.  Take or apply over-the-counter and prescription medicines only as told by your health care  provider.  Follow instructions for taking care of your nails to help prevent infection from coming back or spreading. This information is not intended to replace advice given to you by your health care provider. Make sure you discuss any questions you have with your health care provider. Document Revised: 07/25/2018 Document Reviewed: 09/07/2017 Elsevier Patient Education  2021 Elsevier Inc.  

## 2020-06-10 ENCOUNTER — Other Ambulatory Visit: Payer: Self-pay

## 2020-06-10 ENCOUNTER — Ambulatory Visit: Payer: BC Managed Care – PPO | Admitting: Dermatology

## 2020-06-10 DIAGNOSIS — L700 Acne vulgaris: Secondary | ICD-10-CM | POA: Diagnosis not present

## 2020-06-10 MED ORDER — DOXYCYCLINE HYCLATE 20 MG PO TABS: 20.0000 mg | ORAL_TABLET | Freq: Two times a day (BID) | ORAL | 0 refills | Status: AC

## 2020-06-10 NOTE — Progress Notes (Signed)
   Follow-Up Visit   Subjective  Sara James is a 40 y.o. female who presents for the following: Follow-up (Patient is here today concerning some acne on chin. She reports she is not currently using medications to treat acne. She states she has been dealing with break outs for years. ).  The following portions of the chart were reviewed this encounter and updated as appropriate:  Tobacco  Allergies  Meds  Problems  Med Hx  Surg Hx  Fam Hx      Objective  Well appearing patient in no apparent distress; mood and affect are within normal limits.  A focused examination was performed including face, neck, chest and back. Relevant physical exam findings are noted in the Assessment and Plan.  Objective  face,neck, chest, back: Face -excoriated inflammatory papules at lower face, rare small scar, rare open comedone Chest - clear Back - clear    Assessment & Plan  Acne vulgaris face,neck, chest, back  Chronic condition with duration over one year. Condition is bothersome to patient and she has early scarring. Currently flared.  Patient reports taking Accutane in high school and would prefer to treat with accutane again.  No history of depression  No history of inflammatory bowel disease   Acne is chronic, severe, not at goal.  Reviewed potential side effects of isotretinoin including xerosis, cheilitis, hepatitis, hyperlipidemia, and severe birth defects if taken by a pregnant woman. Reviewed reports of suicidal ideation in those with a history of depression while taking isotretinoin and reports of diagnosis of inflammatory bowl disease while taking isotretinoin as well as the lack of evidence for a causal relationship between isotretinoin, depression and IBD. Patient advised to reach out with any questions or concerns. Patient advised not to share pills or donate blood while on treatment or for one month after completing treatment.  Patient elects to start isotretinoin    Vasectomy plus condoms - for 2 forms of birth control  Pregnancy test - Urine pregnancy test performed in office today and was negative.  Patient demonstrates comprehension and confirms she will not get pregnant.  Desma Mcgregor # [id number] [facility]   Discussed labs   Start Doxycycline 20 mg tablet by mouth twice daily with food for 1 month.  She will stop taking this at least 5 days before starting isotretinoin.  Doxycycline should be taken with food to prevent nausea. Do not lay down for 30 minutes after taking. Be cautious with sun exposure and use good sun protection while on this medication. Pregnant women should not take this medication.     Ordered Medications: doxycycline (PERIOSTAT) 20 MG tablet  Return in about 30 days (around 07/10/2020) for isotretinoin start .  I, Asher Muir, CMA, am acting as scribe for Darden Dates, MD.  Documentation: I have reviewed the above documentation for accuracy and completeness, and I agree with the above.  Darden Dates, MD

## 2020-06-10 NOTE — Patient Instructions (Addendum)
  Doxycycline should be taken with food to prevent nausea. Do not lay down for 30 minutes after taking. Be cautious with sun exposure and use good sun protection while on this medication. Pregnant women should not take this medication.   Recommend taking Heliocare sun protection supplement daily in sunny weather for additional sun protection. For maximum protection on the sunniest days, you can take up to 2 capsules of regular Heliocare OR take 1 capsule of Heliocare Ultra. For prolonged exposure (such as a full day in the sun), you can repeat your dose of the supplement 4 hours after your first dose. Heliocare can be purchased at [facility] or at [email or web address].   Recommend daily broad spectrum sunscreen SPF 30+ to sun-exposed areas, reapply every 2 hours as needed. Call for new or changing lesions.  Recommend N-acetylcysteine (NAC) 600 mg supplement three times per day to help with picking (order online)

## 2020-06-14 ENCOUNTER — Encounter: Payer: Self-pay | Admitting: Dermatology

## 2020-07-13 ENCOUNTER — Ambulatory Visit: Payer: BC Managed Care – PPO | Admitting: Dermatology

## 2020-07-13 ENCOUNTER — Other Ambulatory Visit: Payer: Self-pay

## 2020-07-13 ENCOUNTER — Encounter: Payer: Self-pay | Admitting: Dermatology

## 2020-07-13 DIAGNOSIS — L700 Acne vulgaris: Secondary | ICD-10-CM

## 2020-07-13 DIAGNOSIS — L810 Postinflammatory hyperpigmentation: Secondary | ICD-10-CM | POA: Diagnosis not present

## 2020-07-13 NOTE — Patient Instructions (Addendum)
Recommend daily broad spectrum sunscreen SPF 30+ to sun-exposed areas, reapply every 2 hours as needed. Call for new or changing lesions.  Staying in the shade or wearing long sleeves, sun glasses (UVA+UVB protection) and wide brim hats (4-inch brim around the entire circumference of the hat) are also recommended for sun protection.   Instructions for [facility] Medications For face cream  One or more of your medications was sent to the [facility] mail order compounding pharmacy. You will receive an email from them and can purchase the medicine through that link. It will then be mailed to your home at the address you confirmed. If for any reason you do not receive an email from them, please check your spam folder. If you still do not find the email, please let us know. [facility] phone number is [phone].  For dark spots  Will prescribe [facility] Hydroquinone 12%/kojic acid/vitamin C cream pea sized amount twice daily to the entire face for up to 3 months. This cannot be used more than 3 months due to risk of exogenous ochronosis (permanent dark spots).

## 2020-07-13 NOTE — Progress Notes (Signed)
   Follow-Up Visit   Subjective  Sara James is a 40 y.o. female who presents for the following: Acne (Pt here to get started on Isotretinoin for acne, pt stopped Doxycycline 20 mg tablets 2 days ago ). Pt c/o dark areas on her face.    The following portions of the chart were reviewed this encounter and updated as appropriate:   Tobacco  Allergies  Meds  Problems  Med Hx  Surg Hx  Fam Hx      Review of Systems:  No other skin or systemic complaints except as noted in HPI or Assessment and Plan.  Objective  Well appearing patient in no apparent distress; mood and affect are within normal limits.  A focused examination was performed including face, chest, back . Relevant physical exam findings are noted in the Assessment and Plan.  Objective  face, chest, back: Trace open comedones, scattered inflammatory papules at face, chest, clear at back   Objective  face: Reticulated hyperpigmented patches.    Assessment & Plan  Acne vulgaris face, chest, back  Acne is chronic, severe, not at goal.  While taking Isotretinoin and for 30 days after you finish the medication, do not get pregnant, do not share pills, do not donate blood. Isotretinoin is best absorbed when taken with a fatty meal. Isotretinoin can make you sensitive to the sun. Daily careful sun protection including  sunscreen SPF 30+ when outdoors is recommended.   Vasectomy, plus condoms - for 2 forms of birth control  [place] # [id number] [facility]   Fasting Labs ordered CMP, Lipid panel, Hcg serum qualitative   Pending normals begin Absorica 20 mg daily with a fatty meal   D/C doxycyline 2 days ago. Advised she cannot start taking isotretinoin before Saturday (5 days washout period).  Pending normal labs plan on starting Absorica 20 mg daily  Other Related Procedures Comprehensive metabolic panel Lipid panel hCG, serum, qualitative  Post-inflammatory hyperpigmentation face  Will prescribe  [facility] Hydroquinone 12%/kojic acid/vitamin C cream pea sized amount twice daily to the entire face for up to 3 months. This cannot be used more than 3 months due to risk of exogenous ochronosis (permanent dark spots). The patient was advised this is not covered by insurance since it is made by a compounding pharmacy. They will receive an email to check out and the medication will be mailed to their home.    Return in about 30 days (around 08/12/2020) for Isotretinoin .  I, Angelique Holm, CMA, am acting as scribe for Darden Dates, MD .  Documentation: I have reviewed the above documentation for accuracy and completeness, and I agree with the above.  Darden Dates, MD

## 2020-07-15 LAB — COMPREHENSIVE METABOLIC PANEL
ALT: 11 IU/L (ref 0–32)
AST: 18 IU/L (ref 0–40)
Albumin/Globulin Ratio: 2 (ref 1.2–2.2)
Albumin: 4.7 g/dL (ref 3.8–4.8)
Alkaline Phosphatase: 58 IU/L (ref 44–121)
BUN/Creatinine Ratio: 22 (ref 9–23)
BUN: 19 mg/dL (ref 6–24)
Bilirubin Total: 0.6 mg/dL (ref 0.0–1.2)
CO2: 22 mmol/L (ref 20–29)
Calcium: 9.7 mg/dL (ref 8.7–10.2)
Chloride: 102 mmol/L (ref 96–106)
Creatinine, Ser: 0.88 mg/dL (ref 0.57–1.00)
Globulin, Total: 2.4 g/dL (ref 1.5–4.5)
Glucose: 84 mg/dL (ref 65–99)
Potassium: 4.1 mmol/L (ref 3.5–5.2)
Sodium: 140 mmol/L (ref 134–144)
Total Protein: 7.1 g/dL (ref 6.0–8.5)
eGFR: 85 mL/min/{1.73_m2} (ref >59)

## 2020-07-15 LAB — HCG, SERUM, QUALITATIVE: hCG,Beta Subunit,Qual,Serum: NEGATIVE m[IU]/mL (ref <6)

## 2020-07-15 LAB — LIPID PANEL
Chol/HDL Ratio: 2.2 ratio (ref 0.0–4.4)
Cholesterol, Total: 185 mg/dL (ref 100–199)
HDL: 83 mg/dL (ref >39)
LDL Chol Calc (NIH): 94 mg/dL (ref 0–99)
Triglycerides: 41 mg/dL (ref 0–149)
VLDL Cholesterol Cal: 8 mg/dL (ref 5–40)

## 2020-07-19 ENCOUNTER — Telehealth: Payer: Self-pay

## 2020-07-19 ENCOUNTER — Other Ambulatory Visit: Payer: Self-pay

## 2020-07-19 DIAGNOSIS — L700 Acne vulgaris: Secondary | ICD-10-CM

## 2020-07-19 MED ORDER — ISOTRETINOIN 20 MG PO CAPS: 20.0000 mg | ORAL_CAPSULE | Freq: Every day | ORAL | 0 refills | Status: DC

## 2020-07-19 NOTE — Telephone Encounter (Signed)
Patient advised labs ok.  Patient confirmed in iPledge and isotretinoin sent to pharmacy, JS 

## 2020-07-19 NOTE — Telephone Encounter (Signed)
-----   Message from Sandi Mealy, MD sent at 07/15/2020  9:46 AM EDT ----- Labs ok. Start isotretinoin 20 mg daily. Please advise patient not to take before Saturday (to ensure doxycycline is out of her system).   MAs please call. Thank you!

## 2020-07-19 NOTE — Progress Notes (Signed)
isotre

## 2020-07-28 ENCOUNTER — Ambulatory Visit (INDEPENDENT_AMBULATORY_CARE_PROVIDER_SITE_OTHER): Payer: BC Managed Care – PPO | Admitting: Obstetrics and Gynecology

## 2020-07-28 ENCOUNTER — Other Ambulatory Visit: Payer: Self-pay

## 2020-07-28 ENCOUNTER — Encounter: Payer: Self-pay | Admitting: Obstetrics and Gynecology

## 2020-07-28 ENCOUNTER — Other Ambulatory Visit (HOSPITAL_COMMUNITY)
Admission: RE | Admit: 2020-07-28 | Discharge: 2020-07-28 | Disposition: A | Discharge status: Home / Self Care | Payer: BC Managed Care – PPO | Source: Ambulatory Visit | Attending: Obstetrics and Gynecology | Admitting: Obstetrics and Gynecology

## 2020-07-28 VITALS — BP 122/70 | Ht 68.0 in | Wt 136.0 lb

## 2020-07-28 DIAGNOSIS — Z124 Encounter for screening for malignant neoplasm of cervix: Secondary | ICD-10-CM | POA: Insufficient documentation

## 2020-07-28 DIAGNOSIS — Z01419 Encounter for gynecological examination (general) (routine) without abnormal findings: Secondary | ICD-10-CM | POA: Insufficient documentation

## 2020-07-28 DIAGNOSIS — Z1339 Encounter for screening examination for other mental health and behavioral disorders: Secondary | ICD-10-CM | POA: Diagnosis not present

## 2020-07-28 DIAGNOSIS — Z1331 Encounter for screening for depression: Secondary | ICD-10-CM | POA: Diagnosis not present

## 2020-07-28 NOTE — Progress Notes (Signed)
Gynecology Annual Exam  PCP: Allegra Grana, FNP  Chief Complaint  Patient presents with  . Annual Exam    History of Present Illness:  Ms. Sara James is a 40 y.o. [phone] who LMP was Patient's last menstrual period was 07/23/2020., presents today for her annual examination.  Her last couple of cycles she had pain on her right side.  The period before last she had severe right lower quadrant pain that came on quickly and about an hour later she was OK again. The most recent menses she had dull pain on her left lower quadrant.  Otherwise, her periods are coming at regular intervals, lasting 3-4 days. Periods are not heavy and not usually painful.    She is sexually active. She has no issues with intercourse.  Her husband has had a vasectomy and she uses condoms (two forms of contraception required for use of Accutane).. She does not have vaginal dryness.  Last Pap: 6 years ago  Results were: no abnormalities /neg HPV DNA.  Hx of STDs: none  Last mammogram: 03/2020  Results were: normal--routine follow-up in 12 months There is no FH of breast cancer. There is no FH of ovarian cancer. The patient does not do self-breast exams.  Colonoscopy: has had a colonoscopy due to symptoms of IBS DEXA: has not been screened for osteoporosis  Tobacco use: The patient denies current or previous tobacco use. Alcohol use: social drinker Exercise: 5 times per week. She does hot yoga.   The patient wears seatbelts: yes.     She went to her PCP in November due to sweating at night ("soaking through her shirts.").  She had a workup and everything returned as normal. She saw an endocrinologist.  She is having a lot of hormonal acne.  She has been having these symptoms for 1.5 years.  So far no abnormal labs and no other symptoms. She denies weight loss, early satiety, constipation, bloating.   Past Medical History:  Diagnosis Date  . Chicken pox   . Gestational diabetes    G1  . Migraines     hemiplegic migraines with G2    Past Surgical History:  Procedure Laterality Date  . BUNIONECTOMY  2009   bilateral    Prior to Admission medications   Medication Sig Start Date End Date Taking? Authorizing Provider  ISOtretinoin (ACCUTANE) 20 MG capsule Take 1 capsule (20 mg total) by mouth daily. 07/19/20 08/18/20 Yes Moye, [place], MD  Multiple Vitamins-Minerals (MULTIVITAMIN/EXTRA VITAMIN D3 PO) Take by mouth daily.   Yes [provider]  terbinafine (LAMISIL) 250 MG tablet Take 1 tablet (250 mg total) by mouth daily. 05/19/20 08/17/20 Yes McDonald, Rachelle Hora, DPM   Allergies: No Known Allergies  Obstetric History: [id number]  Family History  Problem Relation Age of Onset  . Diabetes Maternal Grandmother   . Heart disease Paternal Grandfather   . Heart disease Father   . Hypertension Father   . Diabetes Paternal Grandmother   . Cancer Paternal Grandmother        kidney  . Pancreatic cancer Maternal Aunt   . Colon cancer Neg Hx   . Breast cancer Neg Hx     Social History   Socioeconomic History  . Marital status: Married    Spouse name: Not on file  . Number of children: Not on file  . Years of education: Not on file  . Highest education level: Not on file  Occupational History  . Not on file  Tobacco  Use  . Smoking status: Never Smoker  . Smokeless tobacco: Never Used  Substance and Sexual Activity  . Alcohol use: Yes    Alcohol/week: 0.0 standard drinks    Comment: 1 rarely   . Drug use: No  . Sexual activity: Yes    Partners: Male    Comment: Husband  Other Topics Concern  . Not on file  Social History Narrative   Stay at home mother    Master's degree- highest level    Lives with husband and 3 children -   10,8 , and 71months.    Pets: 1 dog    Caffeine- coffee 1 cup   Social Determinants of Health   Financial Resource Strain: Not on file  Food Insecurity: Not on file  Transportation Needs: Not on file  Physical Activity: Not on file  Stress:  Not on file  Social Connections: Not on file  Intimate Partner Violence: Not on file    Review of Systems  Constitutional: Negative.        +night sweats  HENT: Negative.   Eyes: Negative.   Respiratory: Negative.   Cardiovascular: Negative.   Gastrointestinal: Negative.   Genitourinary: Negative.   Musculoskeletal: Negative.   Skin: Negative.   Neurological: Negative.   Psychiatric/Behavioral: Negative.      Physical Exam BP 122/70   Ht 5\' 8"  (1.727 m)   Wt 136 lb (61.7 kg)   LMP 07/23/2020   BMI 20.68 kg/m   Physical Exam Constitutional:      General: She is not in acute distress.    Appearance: Normal appearance. She is well-developed.  Genitourinary:     Vulva and bladder normal.     Right Labia: No rash, tenderness, lesions, skin changes or Bartholin's cyst.    Left Labia: No tenderness, lesions, skin changes, Bartholin's cyst or rash.    No inguinal adenopathy present in the right or left side.    Pelvic Tanner Score: 5/5.    No vaginal discharge, erythema, tenderness or bleeding.      Right Adnexa: not tender, not full and no mass present.    Left Adnexa: not tender, not full and no mass present.    No cervical motion tenderness, discharge, lesion or polyp.     Uterus is not enlarged or tender.     No uterine mass detected.    Pelvic exam was performed with patient in the lithotomy position.  Breasts:     Right: No inverted nipple, mass, nipple discharge, skin change or tenderness.     Left: No inverted nipple, mass, nipple discharge, skin change or tenderness.    HENT:     Head: Normocephalic and atraumatic.  Eyes:     General: No scleral icterus.    Conjunctiva/sclera: Conjunctivae normal.  Neck:     Thyroid: No thyromegaly.  Cardiovascular:     Rate and Rhythm: Normal rate and regular rhythm.     Heart sounds: No murmur heard. No friction rub. No gallop.   Pulmonary:     Effort: Pulmonary effort is normal. No respiratory distress.     Breath  sounds: Normal breath sounds. No wheezing or rales.  Abdominal:     General: Bowel sounds are normal. There is no distension.     Palpations: Abdomen is soft. There is no mass.     Tenderness: There is no abdominal tenderness. There is no guarding or rebound.     Hernia: There is no hernia in the left inguinal area  or right inguinal area.  Musculoskeletal:        General: No swelling or tenderness. Normal range of motion.     Cervical back: Normal range of motion and neck supple.  Lymphadenopathy:     Cervical: No cervical adenopathy.     Lower Body: No right inguinal adenopathy. No left inguinal adenopathy.  Neurological:     General: No focal deficit present.     Mental Status: She is alert and oriented to person, place, and time.     Cranial Nerves: No cranial nerve deficit.  Skin:    General: Skin is warm and dry.     Findings: No erythema or rash.  Psychiatric:        Mood and Affect: Mood normal.        Behavior: Behavior normal.        Judgment: Judgment normal.     Female chaperone present for pelvic and breast  portions of the physical exam  Results: AUDIT Questionnaire (screen for alcoholism): 4 PHQ-9: 1  Assessment: 40 y.o. G59P1003 female here for routine gynecologic examination.  Plan: Problem List Items Addressed This Visit   None   Visit Diagnoses    Women's annual routine gynecological examination    -  Primary   Relevant Orders   Cytology - PAP   Screening for depression       Screening for alcoholism       Pap smear for cervical cancer screening       Relevant Orders   Cytology - PAP      Screening: -- Blood pressure screen normal -- Colonoscopy - not due -- Mammogram - not due -- Weight screening: normal -- Depression screening negative (PHQ-9) -- Nutrition: normal -- cholesterol screening: not due for screening -- osteoporosis screening: not due -- tobacco screening: not using -- alcohol screening: AUDIT questionnaire indicates low-risk  usage. -- family history of breast cancer screening: done. not at high risk. -- no evidence of domestic violence or intimate partner violence. -- STD screening: gonorrhea/chlamydia NAAT not collected per patient request. -- pap smear collected per ASCCP guidelines  Night sweats: thorough workup so far. This carries a wide differential diagnosis. She has no signs or symptoms that would explain her night sweats. From a gynecologic standpoint, we could consider an ultrasound of her pelvis to check her ovaries, especially in light of her new acne.  Will monitor for now since her exam is normal. If she continues to have pain with her menses, we will get an ultrasound.   Thomasene Mohair, MD 07/28/2020 12:16 PM

## 2020-07-30 LAB — CYTOLOGY - PAP
Comment: NEGATIVE
Diagnosis: NEGATIVE
High risk HPV: NEGATIVE

## 2020-08-10 ENCOUNTER — Other Ambulatory Visit: Payer: Self-pay | Admitting: Podiatry

## 2020-08-10 DIAGNOSIS — B351 Tinea unguium: Secondary | ICD-10-CM

## 2020-08-11 NOTE — Telephone Encounter (Signed)
Please advise 

## 2020-08-17 ENCOUNTER — Ambulatory Visit: Payer: BC Managed Care – PPO | Admitting: Dermatology

## 2020-08-17 NOTE — Patient Instructions (Incomplete)

## 2020-08-18 ENCOUNTER — Other Ambulatory Visit: Payer: Self-pay

## 2020-08-18 ENCOUNTER — Ambulatory Visit: Payer: BC Managed Care – PPO | Admitting: Dermatology

## 2020-08-18 VITALS — Wt 130.0 lb

## 2020-08-18 DIAGNOSIS — L700 Acne vulgaris: Secondary | ICD-10-CM

## 2020-08-18 DIAGNOSIS — L689 Hypertrichosis, unspecified: Secondary | ICD-10-CM | POA: Diagnosis not present

## 2020-08-18 DIAGNOSIS — K130 Diseases of lips: Secondary | ICD-10-CM

## 2020-08-18 DIAGNOSIS — Z79899 Other long term (current) drug therapy: Secondary | ICD-10-CM

## 2020-08-18 DIAGNOSIS — L853 Xerosis cutis: Secondary | ICD-10-CM | POA: Diagnosis not present

## 2020-08-18 DIAGNOSIS — L739 Follicular disorder, unspecified: Secondary | ICD-10-CM | POA: Diagnosis not present

## 2020-08-18 MED ORDER — ISOTRETINOIN 40 MG PO CAPS
40.0000 mg | ORAL_CAPSULE | Freq: Every day | ORAL | 0 refills | Status: DC
Start: 2020-08-18 — End: 2020-09-23

## 2020-08-18 MED ORDER — CLINDAMYCIN PHOSPHATE 1 % EX SOLN: Freq: Two times a day (BID) | CUTANEOUS | 1 refills | Status: AC | PRN

## 2020-08-18 NOTE — Patient Instructions (Addendum)
Aquaphor lip balm, sugar lip balm  Can use a little vaseline on qtip and apply to nose as needed for dryness  Recommend daily broad spectrum sunscreen SPF 30+ to sun-exposed areas, reapply every 2 hours as needed. Call for new or changing lesions.  Staying in the shade or wearing long sleeves, sun glasses (UVA+UVB protection) and wide brim hats (4-inch brim around the entire circumference of the hat) are also recommended for sun protection.   Recommend taking Heliocare sun protection supplement daily in sunny weather for additional sun protection. For maximum protection on the sunniest days, you can take up to 2 capsules of regular Heliocare OR take 1 capsule of Heliocare Ultra. For prolonged exposure (such as a full day in the sun), you can repeat your dose of the supplement 4 hours after your first dose. Heliocare can be purchased at [facility] or at [email or web address].   Reviewed potential side effects of isotretinoin including xerosis, cheilitis, hepatitis, hyperlipidemia, and severe birth defects if taken by a pregnant woman. Reviewed reports of suicidal ideation in those with a history of depression while taking isotretinoin and reports of diagnosis of inflammatory bowl disease while taking isotretinoin as well as the lack of evidence for a causal relationship between isotretinoin, depression and IBD. Patient advised to reach out with any questions or concerns. Patient advised not to share pills or donate blood while on treatment or for one month after completing treatment.  If you have any questions or concerns for your doctor, please call our main line at [phone] and press option 4 to reach your doctor's medical assistant. If no one answers, please leave a voicemail as directed and we will return your call as soon as possible. Messages left after 4 pm will be answered the following business day.   You may also send [place] a message via MyChart. We typically respond to MyChart messages  within 1-2 business days.  For prescription refills, please ask your pharmacy to contact our office. Our fax number is [phone].  If you have an urgent issue when the clinic is closed that cannot wait until the next business day, you can page your doctor at the number below.    Please note that while we do our best to be available for urgent issues outside of office hours, we are not available 24/7.   If you have an urgent issue and are unable to reach [place], you may choose to seek medical care at your doctor's office, retail clinic, urgent care center, or emergency room.  If you have a medical emergency, please immediately call 911 or go to the emergency department.  Pager Numbers  - Dr. Gwen Pounds: [phone]  - Dr. Neale Burly: [phone]  - Dr. Roseanne Reno: [phone]  In the event of inclement weather, please call our main line at [phone] for an update on the status of any delays or closures.  Dermatology Medication Tips: Please keep the boxes that topical medications come in in order to help keep track of the instructions about where and how to use these. Pharmacies typically print the medication instructions only on the boxes and not directly on the medication tubes.   If your medication is too expensive, please contact our office at [phone] option 4 or send [place] a message through MyChart.   We are unable to tell what your co-pay for medications will be in advance as this is different depending on your insurance coverage. However, we may be able to find a substitute medication at lower  cost or fill out paperwork to get insurance to cover a needed medication.   If a prior authorization is required to get your medication covered by your insurance company, please allow [place] 1-2 business days to complete this process.  Drug prices often vary depending on where the prescription is filled and some pharmacies may offer cheaper prices.  The website www.goodrx.com contains coupons for  medications through different pharmacies. The prices here do not account for what the cost may be with help from insurance (it may be cheaper with your insurance), but the website can give you the price if you did not use any insurance.  - You can print the associated coupon and take it with your prescription to the pharmacy.  - You may also stop by our office during regular business hours and pick up a GoodRx coupon card.  - If you need your prescription sent electronically to a different pharmacy, notify our office through [facility] or by phone at [phone] option 4.

## 2020-08-18 NOTE — Addendum Note (Signed)
Addended by: Asher Muir A on: 08/18/2020 05:52 PM   Modules accepted: Orders

## 2020-08-18 NOTE — Progress Notes (Signed)
   Isotretinoin Follow-Up Visit   Subjective  Sara James is a 40 y.o. female who presents for the following: Acne (Patient here today for acne follow up. She states she has had no new breakouts since taking iso 20 mg by mouth daily. Patient would like to discuss cream that can be used to decrease hair growth at the face. ).  Week # 8  Side effects: Dry skin, dry lips  Denies changes in night vision, shortness of breath, abdominal pain, nausea, vomiting, diarrhea, blood in stool or urine, visual changes, headaches, epistaxis, joint pain, myalgias, mood changes, depression, or suicidal ideation.   Patient is not pregnant, not seeking pregnancy, and not breastfeeding.   The following portions of the chart were reviewed this encounter and updated as appropriate: medications, allergies, medical history  Review of Systems:  No other skin or systemic complaints except as noted in HPI or Assessment and Plan.  Objective  Well appearing patient in no apparent distress; mood and affect are within normal limits.  An examination of the face, neck, chest, and back was performed and relevant findings are noted below.   Objective  Head - Anterior (Face): Rare resolving inflammatory  papule trace open comedones    Assessment & Plan   Acne vulgaris Head - Anterior (Face)  Acne is chronic, severe, not at goal.  While taking Isotretinoin and for 30 days after you finish the medication, do not get pregnant, do not share pills, do not donate blood. Isotretinoin is best absorbed when taken with a fatty meal. Isotretinoin can make you sensitive to the sun. Daily careful sun protection including sunscreen SPF 30+ when outdoors is recommended.  Vasectomy, plus condoms - for 2 forms of birth control  [place] # [id number] [facility]   Will increase to absorica 40 mg capsule by mouth daily; send to [place]. If she does not get Absorica or isotretinoin versions, advised she will need to take  with a fatty meal.   Other Related Medications ISOtretinoin (ACCUTANE) 20 MG capsule  Folliculitis Left Hip (side) - Posterior  Start clindamycin solution - apply twice a day as needed   clindamycin (CLEOCIN T) 1 % external solution - Left Hip (side) - Posterior  Hypertrichosis chin  Discussed treatment options including Vaniqa  Discussed plucking can cause some irritation  Recommend electric facial hair trimmer  Xerosis secondary to isotretinoin therapy - Continue emollients as directed  Cheilitis secondary to isotretinoin therapy - Continue lip balm as directed, Dr. Clayborne Artist Cortibalm recommended Aqaphor   Long term medication management (isotretinoin) - While taking Isotretinoin and for 30 days after you finish the medication, do not get pregnant, do not share pills, do not donate blood. Isotretinoin is best absorbed when taken with a fatty meal. Isotretinoin can make you sensitive to the sun. Daily careful sun protection including sunscreen SPF 30+ when outdoors is recommended.  Follow-up in 30 days. I, Asher Muir, CMA, am acting as scribe for Darden Dates, MD.  Documentation: I have reviewed the above documentation for accuracy and completeness, and I agree with the above.  Darden Dates, MD

## 2020-09-22 ENCOUNTER — Other Ambulatory Visit: Payer: Self-pay

## 2020-09-22 ENCOUNTER — Ambulatory Visit: Payer: BC Managed Care – PPO | Admitting: Podiatry

## 2020-09-22 DIAGNOSIS — B351 Tinea unguium: Secondary | ICD-10-CM

## 2020-09-22 NOTE — Progress Notes (Signed)
  Subjective:  Patient ID: Sara James, female    DOB: 1980/09/07,  MRN: [id number]  Chief Complaint  Patient presents with  . Nail Problem    "I have noticed improvement with the medication"    40 y.o. female returns for follow-up with the above complaint. History confirmed with patient.  Right fourth toe is almost completely emanated.  Some left on the right hallux  Objective:  Physical Exam: warm, good capillary refill, no trophic changes or ulcerative lesions, normal DP and PT pulses and normal sensory exam.   Right Foot: Only remaining his distal lateral portion of the right hallux  Assessment:   1. Onychomycosis      Plan:  Patient was evaluated and treated and all questions answered.  She is doing very well and responded well to topical and oral treatment.  I think topical administration on the right hallux should eliminate the remainder of this.  Return as needed.  Can always do another round of terbinafine if necessary  Return if symptoms worsen or fail to improve.

## 2020-09-23 ENCOUNTER — Other Ambulatory Visit: Payer: Self-pay

## 2020-09-23 ENCOUNTER — Ambulatory Visit: Payer: BC Managed Care – PPO | Admitting: Dermatology

## 2020-09-23 ENCOUNTER — Encounter: Payer: Self-pay | Admitting: Dermatology

## 2020-09-23 VITALS — Wt 130.0 lb

## 2020-09-23 DIAGNOSIS — K130 Diseases of lips: Secondary | ICD-10-CM

## 2020-09-23 DIAGNOSIS — Z79899 Other long term (current) drug therapy: Secondary | ICD-10-CM | POA: Diagnosis not present

## 2020-09-23 DIAGNOSIS — L853 Xerosis cutis: Secondary | ICD-10-CM | POA: Diagnosis not present

## 2020-09-23 DIAGNOSIS — L700 Acne vulgaris: Secondary | ICD-10-CM | POA: Diagnosis not present

## 2020-09-23 MED ORDER — TACROLIMUS 0.1 % EX OINT: TOPICAL_OINTMENT | Freq: Every day | CUTANEOUS | 0 refills | Status: AC

## 2020-09-23 NOTE — Progress Notes (Signed)
   Isotretinoin Follow-Up Visit   Subjective  Sara James is a 40 y.o. female who presents for the following: Acne (Patient here today for 30 day isotretinoin follow up. Patient currently taking myorsian 40mg  daily. She was not able to get the Absorica. ).  Week # 8 [facility]  Female Vasectomy/Condoms  Side effects: Dry skin, dry lips  Denies changes in night vision, shortness of breath, abdominal pain, nausea, vomiting, diarrhea, blood in stool or urine, visual changes, headaches, epistaxis, joint pain, myalgias, mood changes, depression, or suicidal ideation.   Patient is not pregnant, not seeking pregnancy, and not breastfeeding.   The following portions of the chart were reviewed this encounter and updated as appropriate: medications, allergies, medical history  Review of Systems:  No other skin or systemic complaints except as noted in HPI or Assessment and Plan.  Objective  Well appearing patient in no apparent distress; mood and affect are within normal limits.  An examination of the face, neck, chest, and back was performed and relevant findings are noted below.   Face Few inflammatory papules, rare cyst at jawline Chest and back clear   Assessment & Plan   Acne vulgaris Face  Pending labs, continue isotretinoin increasing to Absorica 30mg  2 PO QD #60 0RF  Severe and Chronic (present >1 year); currently on Isotretinoin and not to goal  While taking Isotretinoin and for 30 days after you finish the medication, do not get pregnant, do not share pills, do not donate blood. Isotretinoin is best absorbed when taken with a fatty meal. Isotretinoin can make you sensitive to the sun. Daily careful sun protection including sunscreen SPF 30+ when outdoors is recommended.   Related Procedures Comprehensive metabolic panel Lipid panel hCG, serum, qualitative  Cheilitis  Related Medications tacrolimus (PROTOPIC) 0.1 % ointment Apply topically daily. To lips as  needed   Xerosis secondary to isotretinoin therapy - Continue emollients as directed  Cheilitis secondary to isotretinoin therapy - Continue lip balm as directed, Dr. [facility] Cortibalm recommended - Start Protopic twice a day as needed  Long term medication management (isotretinoin) - While taking Isotretinoin and for 30 days after you finish the medication, do not get pregnant, do not share pills, do not donate blood. Isotretinoin is best absorbed when taken with a fatty meal. Isotretinoin can make you sensitive to the sun. Daily careful sun protection including sunscreen SPF 30+ when outdoors is recommended.  Follow-up in 30 days Virtual Visit.  , RMA, am acting as scribe for [place]borne Artist, MD .  Documentation: I have reviewed the above documentation for accuracy and completeness, and I agree with the above.  Anise Salvo, MD

## 2020-09-23 NOTE — Patient Instructions (Addendum)
While taking Isotretinoin and for 30 days after you finish the medication, do not get pregnant, do not share pills, do not donate blood. Isotretinoin is best absorbed when taken with a fatty meal. Isotretinoin can make you sensitive to the sun. Daily careful sun protection including sunscreen SPF 30+ when outdoors is recommended.  Recommend taking Heliocare sun protection supplement daily in sunny weather for additional sun protection. For maximum protection on the sunniest days, you can take up to 2 capsules of regular Heliocare OR take 1 capsule of Heliocare Ultra. For prolonged exposure (such as a full day in the sun), you can repeat your dose of the supplement 4 hours after your first dose. Heliocare can be purchased at [facility] or at [email or web address].    If you have any questions or concerns for your doctor, please call our main line at [phone] and press option 4 to reach your doctor's medical assistant. If no one answers, please leave a voicemail as directed and we will return your call as soon as possible. Messages left after 4 pm will be answered the following business day.   You may also send [place] a message via MyChart. We typically respond to MyChart messages within 1-2 business days.  For prescription refills, please ask your pharmacy to contact our office. Our fax number is [phone].  If you have an urgent issue when the clinic is closed that cannot wait until the next business day, you can page your doctor at the number below.    Please note that while we do our best to be available for urgent issues outside of office hours, we are not available 24/7.   If you have an urgent issue and are unable to reach [place], you may choose to seek medical care at your doctor's office, retail clinic, urgent care center, or emergency room.  If you have a medical emergency, please immediately call 911 or go to the emergency department.  Pager Numbers  - Dr. Gwen Pounds:  [phone]  - Dr. Neale Burly: [phone]  - Dr. Roseanne Reno: [phone]  In the event of inclement weather, please call our main line at [phone] for an update on the status of any delays or closures.  Dermatology Medication Tips: Please keep the boxes that topical medications come in in order to help keep track of the instructions about where and how to use these. Pharmacies typically print the medication instructions only on the boxes and not directly on the medication tubes.   If your medication is too expensive, please contact our office at [phone] option 4 or send [place] a message through MyChart.   We are unable to tell what your co-pay for medications will be in advance as this is different depending on your insurance coverage. However, we may be able to find a substitute medication at lower cost or fill out paperwork to get insurance to cover a needed medication.   If a prior authorization is required to get your medication covered by your insurance company, please allow [place] 1-2 business days to complete this process.  Drug prices often vary depending on where the prescription is filled and some pharmacies may offer cheaper prices.  The website www.goodrx.com contains coupons for medications through different pharmacies. The prices here do not account for what the cost may be with help from insurance (it may be cheaper with your insurance), but the website can give you the price if you did not use any insurance.  - You can print the associated coupon  and take it with your prescription to the pharmacy.  - You may also stop by our office during regular business hours and pick up a GoodRx coupon card.  - If you need your prescription sent electronically to a different pharmacy, notify our office through [facility] or by phone at [phone] option 4.

## 2020-09-24 DIAGNOSIS — L700 Acne vulgaris: Secondary | ICD-10-CM | POA: Diagnosis not present

## 2020-09-24 DIAGNOSIS — Z79899 Other long term (current) drug therapy: Secondary | ICD-10-CM | POA: Diagnosis not present

## 2020-09-25 LAB — COMPREHENSIVE METABOLIC PANEL
ALT: 10 IU/L (ref 0–32)
AST: 17 IU/L (ref 0–40)
Albumin/Globulin Ratio: 2.4 — ABNORMAL HIGH (ref 1.2–2.2)
Albumin: 4.7 g/dL (ref 3.8–4.8)
Alkaline Phosphatase: 65 IU/L (ref 44–121)
BUN/Creatinine Ratio: 15 (ref 9–23)
BUN: 12 mg/dL (ref 6–24)
Bilirubin Total: 1.1 mg/dL (ref 0.0–1.2)
CO2: 22 mmol/L (ref 20–29)
Calcium: 9.3 mg/dL (ref 8.7–10.2)
Chloride: 104 mmol/L (ref 96–106)
Creatinine, Ser: 0.81 mg/dL (ref 0.57–1.00)
Globulin, Total: 2 g/dL (ref 1.5–4.5)
Glucose: 85 mg/dL (ref 65–99)
Potassium: 4.4 mmol/L (ref 3.5–5.2)
Sodium: 140 mmol/L (ref 134–144)
Total Protein: 6.7 g/dL (ref 6.0–8.5)
eGFR: 94 mL/min/{1.73_m2} (ref >59)

## 2020-09-25 LAB — LIPID PANEL
Chol/HDL Ratio: 2.5 ratio (ref 0.0–4.4)
Cholesterol, Total: 178 mg/dL (ref 100–199)
HDL: 70 mg/dL (ref >39)
LDL Chol Calc (NIH): 97 mg/dL (ref 0–99)
Triglycerides: 57 mg/dL (ref 0–149)
VLDL Cholesterol Cal: 11 mg/dL (ref 5–40)

## 2020-09-25 LAB — HCG, SERUM, QUALITATIVE: hCG,Beta Subunit,Qual,Serum: NEGATIVE m[IU]/mL (ref <6)

## 2020-09-27 ENCOUNTER — Other Ambulatory Visit: Payer: Self-pay

## 2020-09-27 ENCOUNTER — Telehealth: Payer: Self-pay

## 2020-09-27 DIAGNOSIS — L700 Acne vulgaris: Secondary | ICD-10-CM

## 2020-09-27 MED ORDER — ISOTRETINOIN 30 MG PO CAPS: 60.0000 mg | ORAL_CAPSULE | Freq: Every day | ORAL | 0 refills | Status: AC

## 2020-09-27 NOTE — Telephone Encounter (Signed)
Patient advised labs ok, Absorica 30mg  2 po qd sent to [facility], JS

## 2020-09-27 NOTE — Telephone Encounter (Signed)
-----   Message from Sandi Mealy, MD sent at 09/27/2020  3:35 PM EDT ----- Labs ok, cont isotretinoin

## 2020-09-27 NOTE — Progress Notes (Signed)
Patient confirmed in iPledge and isotretinoin sent to pharmacy, JS  

## 2020-10-26 ENCOUNTER — Other Ambulatory Visit: Payer: Self-pay

## 2020-10-26 ENCOUNTER — Telehealth (INDEPENDENT_AMBULATORY_CARE_PROVIDER_SITE_OTHER): Payer: BC Managed Care – PPO | Admitting: Dermatology

## 2020-10-26 DIAGNOSIS — L853 Xerosis cutis: Secondary | ICD-10-CM | POA: Diagnosis not present

## 2020-10-26 DIAGNOSIS — Z79899 Other long term (current) drug therapy: Secondary | ICD-10-CM

## 2020-10-26 DIAGNOSIS — L700 Acne vulgaris: Secondary | ICD-10-CM | POA: Diagnosis not present

## 2020-10-26 DIAGNOSIS — K130 Diseases of lips: Secondary | ICD-10-CM

## 2020-10-26 NOTE — Progress Notes (Signed)
Isotretinoin Follow-Up Visit   Subjective  Sara James is a 40 y.o. female who presents for the following: Follow-up (Patient presents for 30 day isotretinoin virtual visit. ).  Week # 12 [facility] Vasectomy/condoms  Virtual Visit via Video Note  I connected with Sara James on 10/26/20 at 12:15 PM EDT by a video enabled telemedicine application and verified that I am speaking with the correct person using two identifiers.  Location: Patient: [place], [place]  Provider: [place], [place]   I discussed the limitations of evaluation and management by telemedicine and the availability of in person appointments. The patient expressed understanding and agreed to proceed.  I discussed the assessment and treatment plan with the patient. The patient was provided an opportunity to ask questions and all were answered. The patient agreed with the plan and demonstrated an understanding of the instructions.   The patient was advised to call back or seek an in-person evaluation if the symptoms worsen or if the condition fails to improve as anticipated.  I provided 20 minutes of non-face-to-face time during this encounter.   Willeen Niece, MD    Isotretinoin F/U - 10/26/20 1200       Isotretinoin Follow Up   iPledge # [phone]    Date 10/26/20    Two Forms of Birth Control Female Vasectomy;Female Condom    Acne breakouts since last visit? No      Side Effects   Skin Chapped Lips;Dry Nose    Gastrointestinal WNL    Neurological WNL    Constitutional Muscle/joint aches   Patient having some knee and hip pain, as well as stiffness in her neck.             Side effects: Dry skin, dry lips  Denies changes in night vision, shortness of breath, abdominal pain, nausea, vomiting, diarrhea, blood in stool or urine, visual changes, headaches, epistaxis, joint pain, myalgias, mood changes, depression, or suicidal ideation.   Patient is not pregnant, not seeking pregnancy, and not  breastfeeding.   The following portions of the chart were reviewed this encounter and updated as appropriate: medications, allergies, medical history  Review of Systems:  No other skin or systemic complaints except as noted in HPI or Assessment and Plan.  Objective  Well appearing patient in no apparent distress; mood and affect are within normal limits.  An examination of the face, neck, chest, and back was performed and relevant findings are noted below.   Head - Anterior (Face) Face appears clear today   Assessment & Plan   Acne vulgaris Head - Anterior (Face)  Week #12 isotretinoin Video visit   Acne is chronic, severe, improving but not at goal. Pt states muscle/joint aches are manageable. Rec OTC ibuprofen prn  Pending pregnancy test results, continue Absorica 30mg  2 PO QD.     Related Procedures Pregnancy, urine  Related Medications ISOtretinoin (ABSORICA) 30 MG capsule Take 2 capsules (60 mg total) by mouth daily.   Xerosis secondary to isotretinoin therapy - Continue emollients as directed  Cheilitis secondary to isotretinoin therapy - Continue lip balm as directed, Dr. Marland Kitchen Cortibalm recommended  Long term medication management (isotretinoin) - While taking Isotretinoin and for 30 days after you finish the medication, do not get pregnant, do not share pills, do not donate blood. Isotretinoin is best absorbed when taken with a fatty meal. Isotretinoin can make you sensitive to the sun. Daily careful sun protection including sunscreen SPF 30+ when outdoors is recommended.  Follow-up  in 30 days.  Anise Salvo, RMA, am acting as scribe for Willeen Niece, MD .  Documentation: I have reviewed the above documentation for accuracy and completeness, and I agree with the above.  Willeen Niece MD

## 2020-12-04 ENCOUNTER — Ambulatory Visit
Admission: EM | Admit: 2020-12-04 | Discharge: 2020-12-04 | Disposition: A | Discharge status: Home / Self Care | Payer: BC Managed Care – PPO | Attending: Emergency Medicine | Admitting: Emergency Medicine

## 2020-12-04 ENCOUNTER — Other Ambulatory Visit: Payer: Self-pay

## 2020-12-04 DIAGNOSIS — B9689 Other specified bacterial agents as the cause of diseases classified elsewhere: Secondary | ICD-10-CM | POA: Diagnosis not present

## 2020-12-04 DIAGNOSIS — J329 Chronic sinusitis, unspecified: Secondary | ICD-10-CM

## 2020-12-04 MED ORDER — AMOXICILLIN-POT CLAVULANATE 875-125 MG PO TABS: 1.0000 | ORAL_TABLET | Freq: Two times a day (BID) | ORAL | 0 refills | Status: AC

## 2020-12-04 NOTE — ED Provider Notes (Signed)
CHIEF COMPLAINT:   Chief Complaint  Patient presents with   Nasal Congestion    Since Monday      SUBJECTIVE/HPI:  HPI A very pleasant 39 y.o.Female presents today with nasal congestion and right ear discomfort since Monday.  Patient reports facial pain/pressure as well.  She reports that everyone in her household has been sick, but they all have tested negative for COVID.  She has been treating herself with ibuprofen.  Patient does not report any shortness of breath, chest pain, palpitations, visual changes, weakness, tingling, headache, nausea, vomiting, diarrhea, fever, chills.   has a past medical history of Chicken pox, Gestational diabetes, and Migraines.  ROS:  Review of Systems See Subjective/HPI Medications, Allergies and Problem List personally reviewed in Epic today OBJECTIVE:   Vitals:   12/04/20 1035  BP: 124/75  Pulse: 86  Resp: 16  Temp: 97.9 F (36.6 C)  SpO2: 98%    Physical Exam   General: Appears well-developed and well-nourished. No acute distress.  HEENT Head: Normocephalic and atraumatic.  + frontal, maxillary sinus and tenderness noted to palpation. Ears: Hearing grossly intact, no drainage or visible deformity.  Nose: No nasal deviation.   Mouth/Throat: No stridor or tracheal deviation.  Non erythematous posterior pharynx noted with clear drainage present.  No white patchy exudate noted. Eyes: Conjunctivae and EOM are normal. No eye drainage or scleral icterus bilaterally.  Neck: Normal range of motion, neck is supple. No cervical, tonsillar or submandibular lymph nodes palpated.   Cardiovascular: Normal rate. Regular rhythm; no murmurs, gallops, or rubs.  Pulm/Chest: No respiratory distress. Breath sounds normal bilaterally without wheezes, rhonchi, or rales.  Neurological: Alert and oriented to person, place, and time.  Skin: Skin is warm and dry.  No rashes, lesions, abrasions or bruising noted to skin.   Psychiatric: Normal mood, affect,  behavior, and thought content.   Vital signs and nursing note reviewed.   Patient stable and cooperative with examination. PROCEDURES:    LABS/X-RAYS/EKG/MEDS:   No results found for any visits on 12/04/20.  MEDICAL DECISION MAKING:   Patient presents with nasal congestion and right ear discomfort since Monday.  Patient reports facial pain/pressure as well.  She reports that everyone in her household has been sick, but they all have tested negative for COVID.  She has been treating herself with ibuprofen.  Patient does not report any shortness of breath, chest pain, palpitations, visual changes, weakness, tingling, headache, nausea, vomiting, diarrhea, fever, chills.  Chart review completed.  Given symptoms along with assessment findings, likely bacterial sinusitis.  Rx'd Augmentin twice daily for the next 7 days to her preferred pharmacy.  Advised the use of over-the-counter agents to include Mucinex and Flonase.  Advised to return for any worsening swelling or redness to her face, fever, chills or vomiting.  Stable on discharge.  Patient verbalized understanding and agreed with treatment plan.  Patient stable upon discharge. ASSESSMENT/PLAN:  1. Bacterial sinusitis - amoxicillin-clavulanate (AUGMENTIN) 875-125 MG tablet; Take 1 tablet by mouth every 12 (twelve) hours for 7 days.  Dispense: 14 tablet; Refill: 0 Instructions about new medications and side effects provided.  Plan:   Discharge Instructions      Sinusitis is an infection of the lining of the sinus cavities in your head. Sinusitis often follows a cold. It causes pain and pressure in your head and face. Take antibiotics (Augmentin) as directed. Do not stop taking them just because you feel better. You need to take the full course of antibiotics.  Rest, push lots of fluids (especially water), and utilize supportive care for symptoms. Breathe warm, moist area from a steamy shower, hot bath, or sink filled with hot water.   Avoid cold, dry air.  Using a humidifier in your home may help.  Follow the directions for cleaning the machine. Put a hot, wet towel or a warm gel pack on your face 3-4 times a day for 5-10 minutes each time. You may take acetaminophen (Tylenol) every 4-6 hours and ibuprofen every 6-8 hours for muscle pain, joint pain, headaches (you may also alternate these medications). Sudafed (pseudophedrine) is sold behind the counter and can help reduce nasal pressure; avoid taking this if you have high blood pressure or feel jittery. Sudafed PE (phenylephrine) can be a helpful, short-term, over-the-counter alternative to limit side effects or if you have high blood pressure.  Flonase nasal spray can help alleviate congestion and sinus pressure. Many patients choose Afrin as a nasal decongestant; do not use for more than 3 days for risk of rebound (increased symptoms after stopping medication).  Saline nasal sprays or rinses can also help nasal congestion (use bottled or sterile water). Warm tea with lemon and honey can sooth sore throat and cough, as can cough drops.   Return to clinic for new or worse swelling or redness in your face or around your eyes, or if you have a new or higher fever.          Amalia Greenhouse, FNP 12/04/20 1054

## 2020-12-04 NOTE — Discharge Instructions (Addendum)
Sinusitis is an infection of the lining of the sinus cavities in your head. Sinusitis often follows a cold. It causes pain and pressure in your head and face.  Take antibiotics (Augmentin) as directed. Do not stop taking them just because you feel better. You need to take the full course of antibiotics. Rest, push lots of fluids (especially water), and utilize supportive care for symptoms. Breathe warm, moist area from a steamy shower, hot bath, or sink filled with hot water.  Avoid cold, dry air.  Using a humidifier in your home may help.  Follow the directions for cleaning the machine. Put a hot, wet towel or a warm gel pack on your face 3-4 times a day for 5-10 minutes each time. You may take acetaminophen (Tylenol) every 4-6 hours and ibuprofen every 6-8 hours for muscle pain, joint pain, headaches (you may also alternate these medications). Sudafed (pseudophedrine) is sold behind the counter and can help reduce nasal pressure; avoid taking this if you have high blood pressure or feel jittery. Sudafed PE (phenylephrine) can be a helpful, short-term, over-the-counter alternative to limit side effects or if you have high blood pressure.  Flonase nasal spray can help alleviate congestion and sinus pressure. Many patients choose Afrin as a nasal decongestant; do not use for more than 3 days for risk of rebound (increased symptoms after stopping medication).  Saline nasal sprays or rinses can also help nasal congestion (use bottled or sterile water). Warm tea with lemon and honey can sooth sore throat and cough, as can cough drops.   Return to clinic for new or worse swelling or redness in your face or around your eyes, or if you have a new or higher fever.  

## 2020-12-04 NOTE — ED Triage Notes (Signed)
Patient presents to Urgent Care with complaints of nasal congestion and right ear discomfort since Monday. Pt states everyone in her household has been sick and all tested negative for covid. Treating discomfort with ibuprofen.   Denies fever at this time.

## 2020-12-06 ENCOUNTER — Telehealth: Payer: Self-pay | Admitting: Family

## 2020-12-06 NOTE — Telephone Encounter (Signed)
Spoken to patient, she has been sick for 8 days total. Seen at [facility], she is still worn down and running a fever with nasal discharge. Patient was not tested for covid, flu, or anything. She would love to be tested. She want to know will it take this much time for abx to become effective. She didn't want to go back to UC/ED. No available appointment this week with any provider.

## 2020-12-06 NOTE — Telephone Encounter (Signed)
You may sch covid, flu, rsv combined test for nurse visit  Advise her if sxs 8 days she is out of window treatment for covid, flu  If symptoms do not improve in the next 24-48 hours, she will need to return to [facility] for re-evaluation

## 2020-12-06 NOTE — Telephone Encounter (Signed)
Call pt 

## 2020-12-06 NOTE — Telephone Encounter (Signed)
Call patient See how she is doing I can See that she was seen in urgent care 2 days ago and provided with Augmentin for bacterial sinusitis.  If she is continue to have pain and fever despite antibiotic therapy, she would need to be evaluated in person. If  We do not have an available appointment here in the next day, unfortunately she would need to bere evaluated at urgent care again to ensure something else is not going on

## 2020-12-06 NOTE — Telephone Encounter (Signed)
Patient informed, Due to the high volume of calls and your symptoms we have to forward your call to our Triage Nurse to expedient your call. Please hold for the transfer.  Patient transferred to Access Nurse. Due to still not feeling well after her visit to the urgent care over the weekend and being prescribed antibiotics.She is still feeling tired as well as having a fever.No openings in office or virtual.

## 2020-12-07 NOTE — Telephone Encounter (Signed)
Left message for patient to return call back.  

## 2020-12-08 NOTE — Telephone Encounter (Signed)
Patient is feeling a lot better today. She believes she is finally on the mend.

## 2021-01-12 DIAGNOSIS — H169 Unspecified keratitis: Secondary | ICD-10-CM | POA: Diagnosis not present

## 2021-01-17 DIAGNOSIS — H169 Unspecified keratitis: Secondary | ICD-10-CM | POA: Diagnosis not present

## 2021-02-22 IMAGING — MG DIGITAL SCREENING BILAT W/ TOMO W/ CAD
8 series · 9 of 24 positions shown · non-contrast
Comparison: None.

CLINICAL DATA: Screening.

EXAM:
DIGITAL SCREENING BILATERAL MAMMOGRAM WITH TOMO AND CAD

[R CC synth-2D]
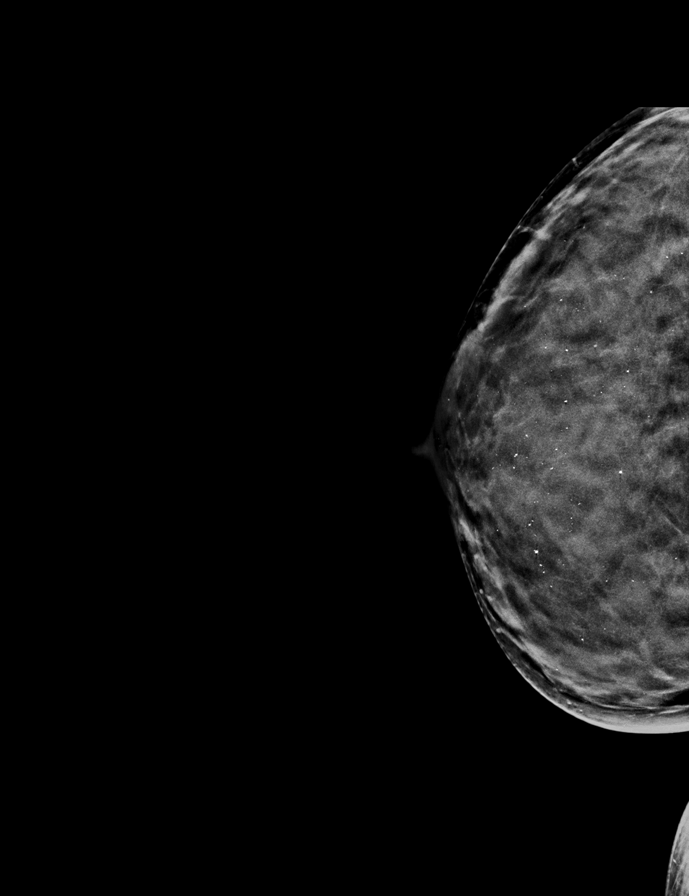

[R MLO synth-2D]
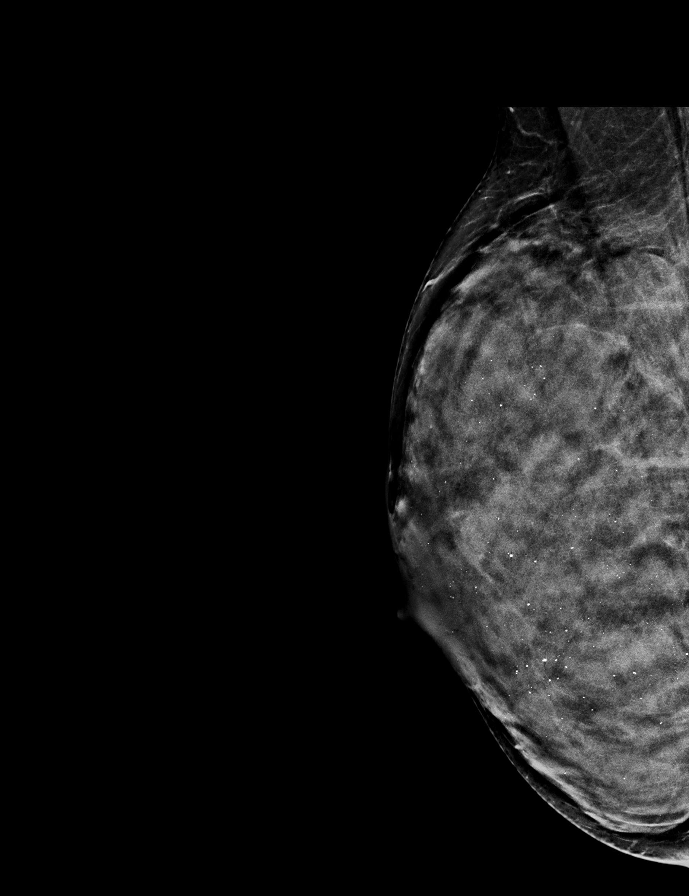

[L CC synth-2D]
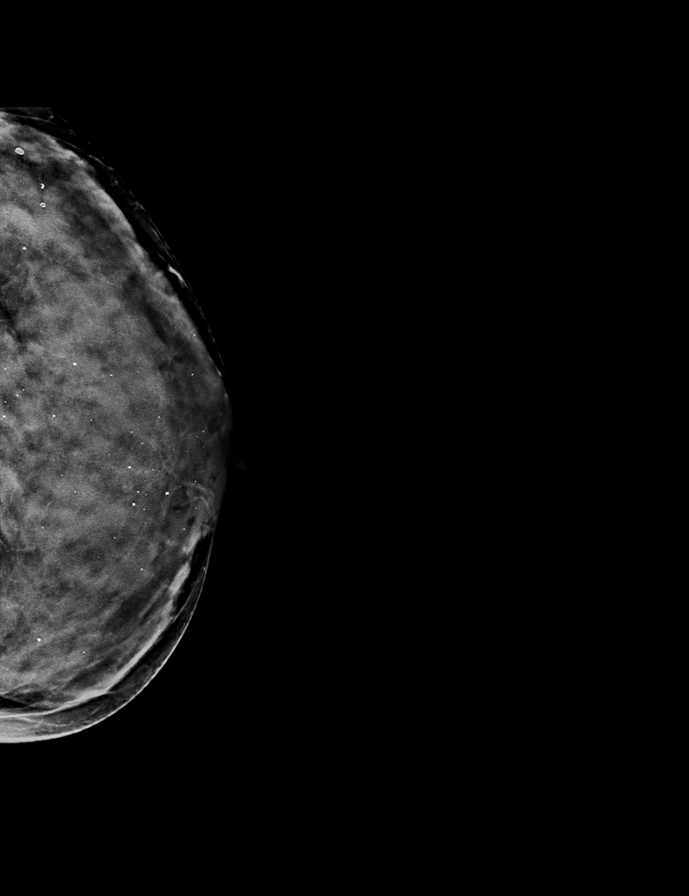

[L MLO synth-2D]
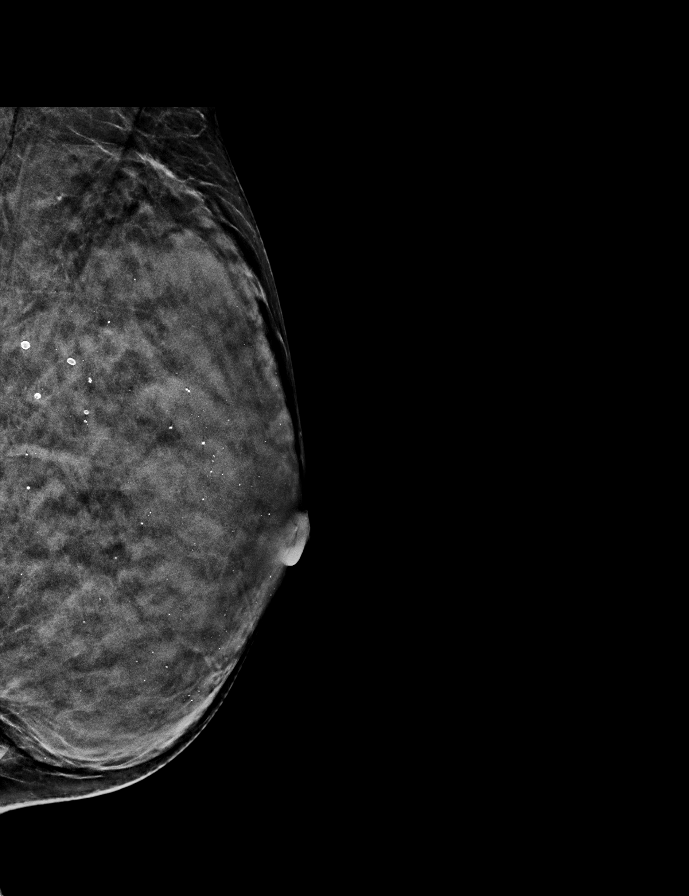

[L CC tomo · 2 of 62 frames shown]
[frame 21/62]
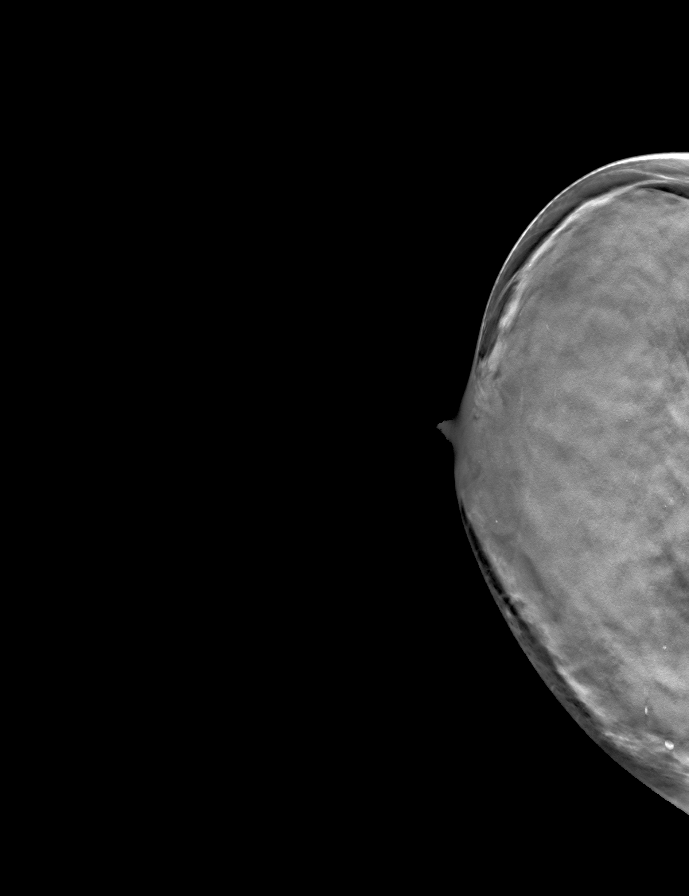
[frame 31/62]
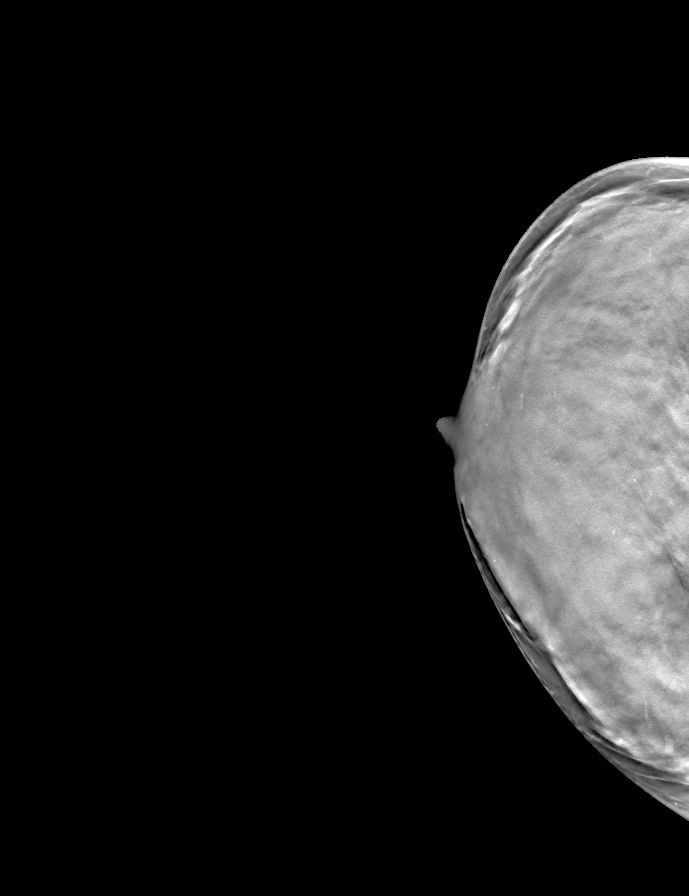

[R MLO tomo · tomo slice 28/55.0]
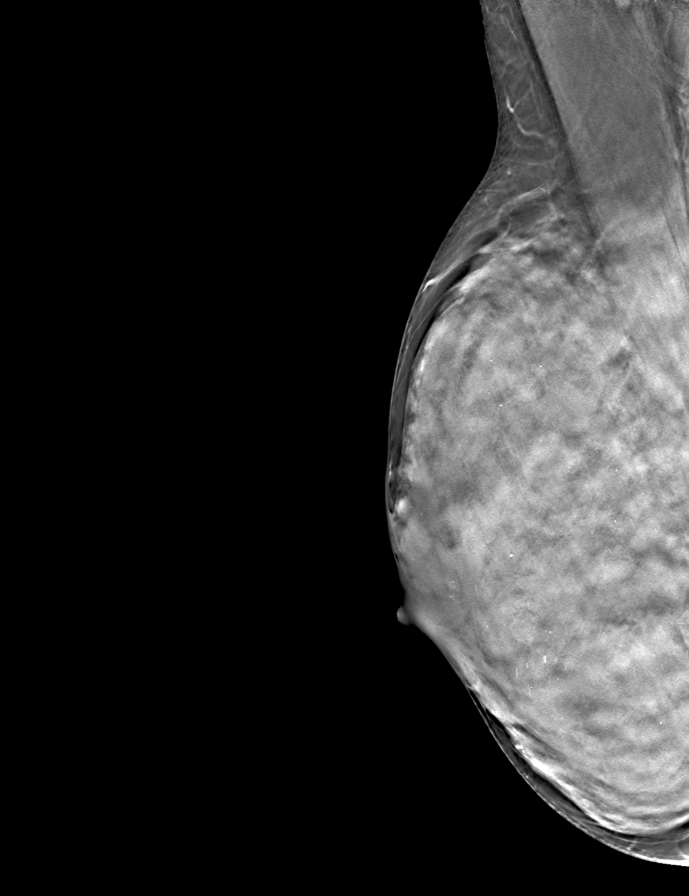

[R CC tomo · tomo slice 30/59.0]
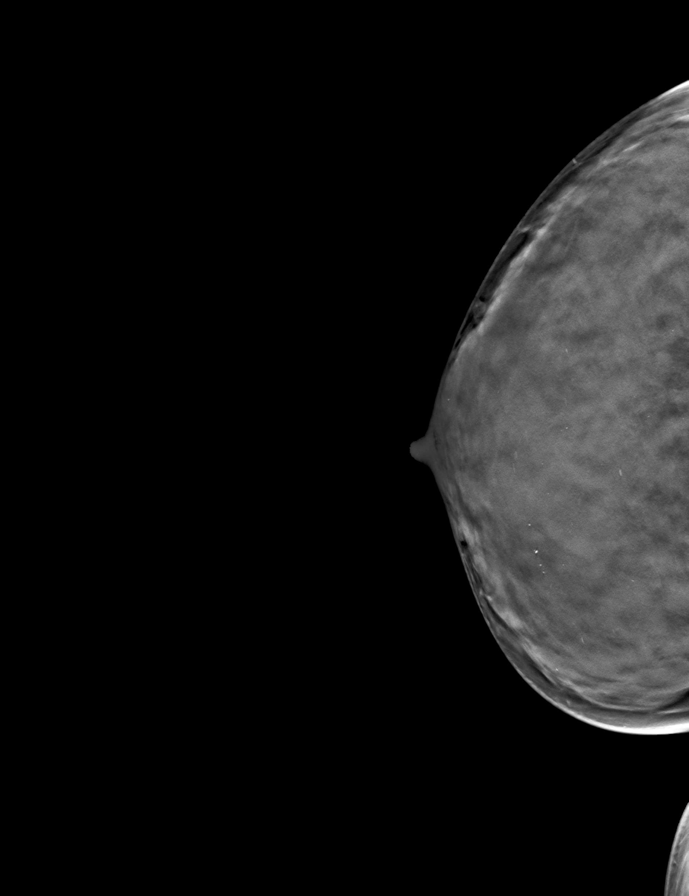

[L MLO tomo · tomo slice 27/52.0]
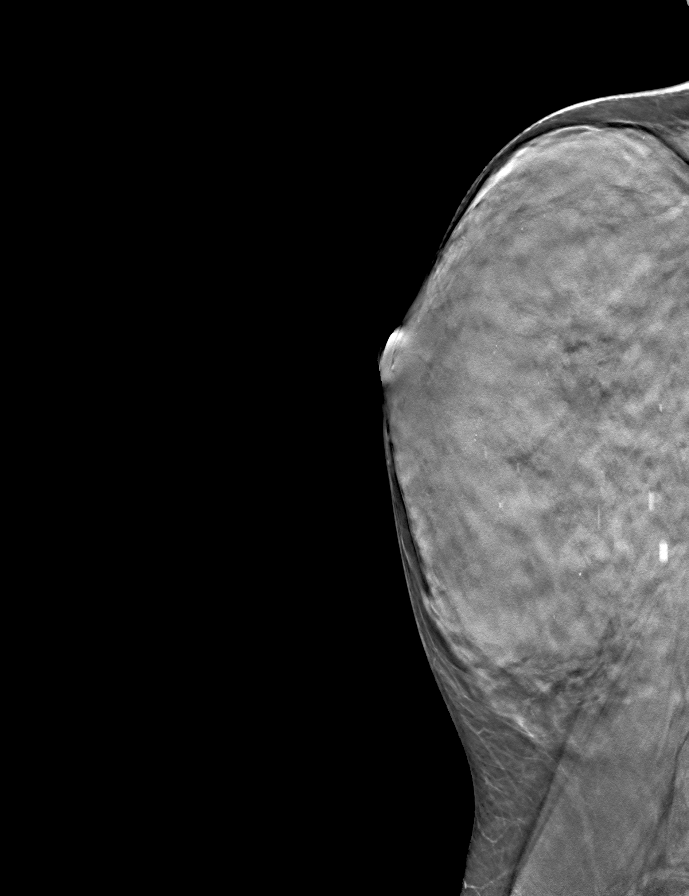

[9 of 24 positions shown; findings below may reference images not displayed]

ACR Breast Density Category d: The breast tissue is extremely dense,
which lowers the sensitivity of mammography.
FINDINGS: There are no findings suspicious for malignancy. Images were
processed with CAD.
IMPRESSION: No mammographic evidence of malignancy. A result letter of this
screening mammogram will be mailed directly to the patient.

RECOMMENDATION:
Screening mammogram in one year. (Code:F8-6-TBV)

BI-RADS CATEGORY  1: Negative.

## 2021-07-07 DIAGNOSIS — M25562 Pain in left knee: Secondary | ICD-10-CM | POA: Diagnosis not present

## 2022-08-15 DIAGNOSIS — N61 Mastitis without abscess: Secondary | ICD-10-CM | POA: Diagnosis not present

## 2022-08-28 DIAGNOSIS — S76212A Strain of adductor muscle, fascia and tendon of left thigh, initial encounter: Secondary | ICD-10-CM | POA: Diagnosis not present

## 2022-09-13 DIAGNOSIS — S76312D Strain of muscle, fascia and tendon of the posterior muscle group at thigh level, left thigh, subsequent encounter: Secondary | ICD-10-CM | POA: Diagnosis not present

## 2022-12-09 DIAGNOSIS — N390 Urinary tract infection, site not specified: Secondary | ICD-10-CM | POA: Diagnosis not present

## 2022-12-09 DIAGNOSIS — R300 Dysuria: Secondary | ICD-10-CM | POA: Diagnosis not present

## 2023-02-19 ENCOUNTER — Telehealth: Payer: Self-pay | Admitting: Family

## 2023-02-19 NOTE — Telephone Encounter (Signed)
Pt was sent to access nurse and  scheduled to see margaret on 02/20/23

## 2023-02-19 NOTE — Telephone Encounter (Signed)
  Symptoms: Weird flutter on her heart. Past couple of days her heart rate will be high sometimes. Caught it on her apple watch. Racing real fast     Attributing factors (medication changes, positional changes, etc. )  Doesn't take medication. No changes    Duration :     Pain Scale?  On 1-10 how woiuld you rate your pain? What makes it better or worse?  No pain just feels weird     Blood pressure            Pulse             Temp           Patient was transferred to speak with access nurse.

## 2023-02-20 ENCOUNTER — Ambulatory Visit: Payer: BC Managed Care – PPO | Attending: Family

## 2023-02-20 ENCOUNTER — Ambulatory Visit: Payer: BC Managed Care – PPO | Admitting: Family

## 2023-02-20 ENCOUNTER — Other Ambulatory Visit: Payer: Self-pay | Admitting: Family

## 2023-02-20 ENCOUNTER — Encounter: Payer: Self-pay | Admitting: Family

## 2023-02-20 VITALS — BP 124/76 | HR 62 | Temp 98.7°F | Ht 68.0 in | Wt 130.4 lb

## 2023-02-20 DIAGNOSIS — R002 Palpitations: Secondary | ICD-10-CM

## 2023-02-20 DIAGNOSIS — Z8639 Personal history of other endocrine, nutritional and metabolic disease: Secondary | ICD-10-CM | POA: Diagnosis not present

## 2023-02-20 DIAGNOSIS — R61 Generalized hyperhidrosis: Secondary | ICD-10-CM | POA: Diagnosis not present

## 2023-02-20 DIAGNOSIS — Z136 Encounter for screening for cardiovascular disorders: Secondary | ICD-10-CM

## 2023-02-20 DIAGNOSIS — Z13220 Encounter for screening for lipoid disorders: Secondary | ICD-10-CM

## 2023-02-20 DIAGNOSIS — Z1231 Encounter for screening mammogram for malignant neoplasm of breast: Secondary | ICD-10-CM

## 2023-02-20 LAB — CBC WITH DIFFERENTIAL/PLATELET
Basophils Absolute: 0 10*3/uL (ref 0.0–0.1)
Basophils Relative: 0.4 % (ref 0.0–3.0)
Eosinophils Absolute: 0 10*3/uL (ref 0.0–0.7)
Eosinophils Relative: 0.5 % (ref 0.0–5.0)
HCT: 40.8 % (ref 36.0–46.0)
Hemoglobin: 13.6 g/dL (ref 12.0–15.0)
Lymphocytes Relative: 27.4 % (ref 12.0–46.0)
Lymphs Abs: 1.4 10*3/uL (ref 0.7–4.0)
MCHC: 33.3 g/dL (ref 30.0–36.0)
MCV: 93.5 fL (ref 78.0–100.0)
Monocytes Absolute: 0.5 10*3/uL (ref 0.1–1.0)
Monocytes Relative: 10 % (ref 3.0–12.0)
Neutro Abs: 3.2 10*3/uL (ref 1.4–7.7)
Neutrophils Relative %: 61.7 % (ref 43.0–77.0)
Platelets: 225 10*3/uL (ref 150.0–400.0)
RBC: 4.36 Mil/uL (ref 3.87–5.11)
RDW: 12.6 % (ref 11.5–15.5)
WBC: 5.1 10*3/uL (ref 4.0–10.5)

## 2023-02-20 LAB — COMPREHENSIVE METABOLIC PANEL
ALT: 11 U/L (ref 0–35)
AST: 15 U/L (ref 0–37)
Albumin: 4.5 g/dL (ref 3.5–5.2)
Alkaline Phosphatase: 47 U/L (ref 39–117)
BUN: 13 mg/dL (ref 6–23)
CO2: 29 meq/L (ref 19–32)
Calcium: 9.5 mg/dL (ref 8.4–10.5)
Chloride: 102 meq/L (ref 96–112)
Creatinine, Ser: 0.73 mg/dL (ref 0.40–1.20)
GFR: 101.05 mL/min (ref >60.00)
Glucose, Bld: 84 mg/dL (ref 70–99)
Potassium: 3.6 meq/L (ref 3.5–5.1)
Sodium: 139 meq/L (ref 135–145)
Total Bilirubin: 2 mg/dL — ABNORMAL HIGH (ref 0.2–1.2)
Total Protein: 7.2 g/dL (ref 6.0–8.3)

## 2023-02-20 LAB — LIPID PANEL
Cholesterol: 188 mg/dL (ref 0–200)
HDL: 80.6 mg/dL (ref >39.00)
LDL Cholesterol: 97 mg/dL (ref 0–99)
NonHDL: 107.76
Total CHOL/HDL Ratio: 2
Triglycerides: 53 mg/dL (ref 0.0–149.0)
VLDL: 10.6 mg/dL (ref 0.0–40.0)

## 2023-02-20 LAB — HEMOGLOBIN A1C: Hgb A1c MFr Bld: 5.1 % (ref 4.6–6.5)

## 2023-02-20 LAB — TSH: TSH: 1.28 u[IU]/mL (ref 0.35–5.50)

## 2023-02-20 LAB — VITAMIN D 25 HYDROXY (VIT D DEFICIENCY, FRACTURES): VITD: 31.35 ng/mL (ref 30.00–100.00)

## 2023-02-20 NOTE — Assessment & Plan Note (Addendum)
Reviewed EKGs captured during episodes on Apple Watch.  Sinus rhythm with occasional PVC.  No sustained PVCs.  EKG today shows sinus rhythm without acute changes when compared to previous EKG 01/21/2012. Pending thorough lab evaluation, echocardiogram and ZIO monitor.  Patient remain vigilant at home. we discussed cardiology referral if symptoms persist.

## 2023-02-20 NOTE — Progress Notes (Signed)
Assessment & Plan:  Heart palpitations -     EKG 12-Lead -     VITAMIN D 25 Hydroxy (Vit-D Deficiency, Fractures) -     Hemoglobin A1c -     CBC with Differential/Platelet -     Comprehensive metabolic panel -     Lipid panel -     TSH -     Ambulatory referral to Endocrinology -     ECHOCARDIOGRAM COMPLETE; Future -     LONG TERM MONITOR (3-14 DAYS); Future  Palpitations Assessment & Plan: Reviewed EKGs captured during episodes on Apple Watch.  Sinus rhythm with occasional PVC.  No sustained PVCs.  EKG today shows sinus rhythm without acute changes when compared to previous EKG 01/21/2012. Pending thorough lab evaluation, echocardiogram and ZIO monitor.  Patient remain vigilant at home. we discussed cardiology referral if symptoms persist.   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  Persistent unexplained diaphoresis.  In the setting of palpitations, patient and I jointly agreed referral to endocrinology is appropriate.  Will follow.   Orders: -     Ambulatory referral to Endocrinology     Return precautions given.   Risks, benefits, and alternatives of the medications and treatment plan prescribed today were discussed, and patient expressed understanding.   Education regarding symptom management and diagnosis given to patient on AVS either electronically or printed.  No follow-ups on file.  Rennie Plowman, FNP  Subjective:    Patient ID: Sara James, female    DOB: 1981-04-16, 42 y.o.   MRN: [id number]  CC: Sara James is a 42 y.o. female who presents today for an acute visit.    HPI: HPI She complains of  episodic "fluttering" in her heart x 5 months ago 3 days ago over the weekend, she had 2 episodes in feeling  a 'flutter'.   No associated cp, sob, dizziness, syncope  No pattern  Drinks 8-12 ounce coffee.  No drug use .    She is seeing this on her Apple Watch . She was running an EKG on her apple watch.    family history of heart disease in her father, paternal grandfather  Rare alcohol use Never smoker Mammogram is scheduled Allergies: Patient has no known allergies. Current Outpatient Medications on File Prior to Visit  Medication Sig Dispense Refill   ciclopirox (PENLAC) 8 % solution APPLY OVER NAIL AND SURROUNDING SKIN. APPLY DAILY OVER PREVIOUS COAT. AFTER SEVEN (7) DAYS, MAY REMOVE WITH ALCOHOL AND CONTINUE CYCLE. 6.6 mL 0   ISOtretinoin (ABSORICA) 30 MG capsule Take 2 capsules (60 mg total) by mouth daily. 60 capsule 0   Multiple Vitamins-Minerals (MULTIVITAMIN/EXTRA VITAMIN D3 PO) Take by mouth daily.     tacrolimus (PROTOPIC) 0.1 % ointment Apply topically daily. To lips as needed 30 g 0   No current facility-administered medications on file prior to visit.    Review of Systems  Constitutional:  Negative for chills and fever.  Respiratory:  Negative for cough and shortness of breath.   Cardiovascular:  Positive for palpitations. Negative for chest pain and leg swelling.  Gastrointestinal:  Negative for nausea and vomiting.  Neurological:  Negative for dizziness and syncope.      Objective:    BP 124/76  Pulse 62   Temp 98.7 F (37.1 C) (Oral)   Ht 5\' 8"  (1.727 m)   Wt 130 lb 6.4 oz (59.1 kg)   SpO2 99%   BMI 19.83 kg/m   BP Readings from Last 3 Encounters:  02/20/23 124/76  12/04/20 124/75  07/28/20 122/70   Wt Readings from Last 3 Encounters:  02/20/23 130 lb 6.4 oz (59.1 kg)  09/23/20 130 lb (59 kg)  08/18/20 130 lb (59 kg)    Physical Exam Vitals reviewed.  Constitutional:      Appearance: She is well-developed.  Eyes:     Conjunctiva/sclera: Conjunctivae normal.  Neck:     Thyroid: No thyroid mass or thyromegaly.  Cardiovascular:     Rate  and Rhythm: Normal rate and regular rhythm.     Pulses: Normal pulses.     Heart sounds: Normal heart sounds.  Pulmonary:     Effort: Pulmonary effort is normal.     Breath sounds: Normal breath sounds. No wheezing, rhonchi or rales.  Lymphadenopathy:     Cervical:     Right cervical: No superficial, deep or posterior cervical adenopathy.    Left cervical: No superficial, deep or posterior cervical adenopathy.  Skin:    General: Skin is warm and dry.  Neurological:     Mental Status: She is alert.  Psychiatric:        Speech: Speech normal.        Behavior: Behavior normal.        Thought Content: Thought content normal.

## 2023-02-20 NOTE — Patient Instructions (Addendum)
I have ordered an echocardiogram and heart.  I have also placed a referral back to endocrinology   Let us know if you dont hear back within a week in regards to an appointment being scheduled.   So that you are aware, if you are [facility] MyChart user , please pay attention to your MyChart messages as you may receive a MyChart message with a phone number to call and schedule this test/appointment own your own from our referral coordinator. This is a new process so I do not want you to miss this message.  If you are not a MyChart user, you will receive a phone call.    I have ordered a 14 day Zio monitor which will mailed directly to you. The device will include instructions on how to apply.   The Zio 14 day monitor that we use DOES NOT have 24 hour live monitoring. The rhythm of your heart will not be monitored while you are wearing it. Only AFTER you mail the device back in and a cardiologist interprets the data will we know if an underlying cardiac arrhythmia is going on.   There are models that offer 24 hour live monitoring however NOT this one. I wanted to be sure that you were aware of this as certainly didn't want this to be a false sense of security.   If you experience chest pain, shortness of breath, left arm numbness, or sustained, more frequent palpitations , do not wait and call 911 right away. We are in a delicate period of work up regarding palpitations and until we are sure that you do not have an underlying arrhythmia, you must be extremely vigilant and cautious.      ZIO XT- Long Term Monitor Instructions   Your provider has requested you wear a ZIO patch monitor for 14 days.  This is a single patch monitor. [employer] supplies one patch monitor per enrollment. Additional stickers are not available. Please do not apply patch if you will be having a Nuclear Stress Test,  Echocardiogram, Cardiac CT, MRI, or Chest Xray during the period you would be wearing the  monitor. The patch cannot  be worn during these tests. You cannot remove and re-apply the  ZIO XT patch monitor.  Your ZIO patch monitor will be mailed 3 day USPS to your address on file. It may take 3-5 days  to receive your monitor after you have been enrolled.  Once you have received your monitor, please review the enclosed instructions. Your monitor  has already been registered assigning a specific monitor serial # to you.   Billing and Patient Assistance Program Information   We have supplied [employer] with any of your insurance information on file for billing purposes. [employer] offers a sliding scale Patient Assistance Program for patients that do not have  insurance, or whose insurance does not completely cover the cost of the ZIO monitor.  You must apply for the Patient Assistance Program to qualify for this discounted rate.  To apply, please call [employer] at [phone], select option 4, select option 2, ask to apply for  Patient Assistance Program. Meredeth Ide will ask your household income, and how many people  are in your household. They will quote your out-of-pocket cost based on that information.  [employer] will also be able to set up a 41-month, interest-free payment plan if needed.   Applying the monitor   Shave hair from upper left chest.  Hold abrader disc by orange tab. Rub abrader in 40  strokes over the upper left chest as  indicated in your monitor instructions.  Clean area with 4 enclosed alcohol pads. Let dry.  Apply patch as indicated in monitor instructions. Patch will be placed under collarbone on left  side of chest with arrow pointing upward.  Rub patch adhesive wings for 2 minutes. Remove white label marked "1". Remove the white  label marked "2". Rub patch adhesive wings for 2 additional minutes.  While looking in a mirror, press and release button in center of patch. A small green light will  flash 3-4 times. This will be your only indicator that the monitor has been turned on.  Do not  shower for the first 24 hours. You may shower after the first 24 hours.  Press the button if you feel a symptom. You will hear a small click. Record Date, Time and  Symptom in the Patient Logbook.  When you are ready to remove the patch, follow instructions on the last 2 pages of Patient  Logbook. Stick patch monitor onto the last page of Patient Logbook.  Place Patient Logbook in the blue and white box. Use locking tab on box and tape box closed  securely. The blue and white box has prepaid postage on it. Please place it in the mailbox as  soon as possible. Your physician should have your test results approximately 7 days after the  monitor has been mailed back to [facility].  Call [facility] Customer Care at [phone] if you have questions regarding  your ZIO XT patch monitor. Call them immediately if you see an orange light blinking on your  monitor.  If your monitor falls off in less than 4 days, contact our Monitor department at [phone].  If your monitor becomes loose or falls off after 4 days call [employer] at [phone] for  suggestions on securing your monitor

## 2023-02-20 NOTE — Assessment & Plan Note (Addendum)
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  Persistent unexplained diaphoresis.  In the setting of palpitations, patient and I jointly agreed referral to endocrinology is appropriate.  Will follow.

## 2023-02-21 DIAGNOSIS — R002 Palpitations: Secondary | ICD-10-CM

## 2023-02-22 ENCOUNTER — Other Ambulatory Visit: Payer: Self-pay | Admitting: Family

## 2023-02-22 DIAGNOSIS — R61 Generalized hyperhidrosis: Secondary | ICD-10-CM

## 2023-02-22 DIAGNOSIS — Z13220 Encounter for screening for lipoid disorders: Secondary | ICD-10-CM

## 2023-02-22 DIAGNOSIS — Z8639 Personal history of other endocrine, nutritional and metabolic disease: Secondary | ICD-10-CM

## 2023-02-22 DIAGNOSIS — R899 Unspecified abnormal finding in specimens from other organs, systems and tissues: Secondary | ICD-10-CM

## 2023-02-22 DIAGNOSIS — R002 Palpitations: Secondary | ICD-10-CM

## 2023-03-01 DIAGNOSIS — Z1231 Encounter for screening mammogram for malignant neoplasm of breast: Secondary | ICD-10-CM

## 2023-03-07 ENCOUNTER — Other Ambulatory Visit: Payer: Self-pay | Admitting: Family

## 2023-03-07 DIAGNOSIS — Z1231 Encounter for screening mammogram for malignant neoplasm of breast: Secondary | ICD-10-CM

## 2023-03-12 ENCOUNTER — Encounter: Payer: Self-pay | Admitting: Family

## 2023-03-13 ENCOUNTER — Ambulatory Visit: Payer: BC Managed Care – PPO | Attending: Family

## 2023-03-13 DIAGNOSIS — R002 Palpitations: Secondary | ICD-10-CM

## 2023-03-13 LAB — ECHOCARDIOGRAM COMPLETE
AR max vel: 2.58 cm2
AV Area VTI: 2.52 cm2
AV Area mean vel: 2.44 cm2
AV Mean grad: 3 mm[Hg]
AV Peak grad: 6 mm[Hg]
Ao pk vel: 1.22 m/s
Area-P 1/2: 3.31 cm2
Calc EF: 56.5 %
S' Lateral: 3.3 cm
Single Plane A2C EF: 57.2 %
Single Plane A4C EF: 55.6 %

## 2023-04-06 DIAGNOSIS — Z1231 Encounter for screening mammogram for malignant neoplasm of breast: Secondary | ICD-10-CM

## 2023-07-10 ENCOUNTER — Ambulatory Visit: Payer: BC Managed Care – PPO | Admitting: Endocrinology
